# Patient Record
Sex: Female | Born: 1985 | Race: White | Hispanic: No | Marital: Single | Smoking: Current every day smoker
Health system: Southern US, Community
[De-identification: ages and names within clinical notes are randomized; demographics above are authoritative.]

## PROBLEM LIST (undated history)

## (undated) DIAGNOSIS — Z789 Other specified health status: Secondary | ICD-10-CM

## (undated) HISTORY — PX: NO PAST SURGERIES: SHX2092

## (undated) HISTORY — DX: Other specified health status: Z78.9

---
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
## 2006-08-19 ENCOUNTER — Encounter: Admission: RE | Admit: 2006-08-19 | Discharge: 2006-08-19 | Payer: Self-pay | Admitting: Family Medicine

## 2006-08-28 ENCOUNTER — Encounter: Admission: RE | Admit: 2006-08-28 | Discharge: 2006-08-28 | Payer: Self-pay | Admitting: Family Medicine

## 2007-05-24 ENCOUNTER — Other Ambulatory Visit: Admission: RE | Admit: 2007-05-24 | Discharge: 2007-05-24 | Payer: Self-pay | Admitting: Obstetrics and Gynecology

## 2007-12-02 ENCOUNTER — Ambulatory Visit: Payer: Self-pay | Admitting: Family Medicine

## 2007-12-02 ENCOUNTER — Inpatient Hospital Stay (HOSPITAL_COMMUNITY): Admission: AD | Admit: 2007-12-02 | Discharge: 2007-12-04 | Payer: Self-pay | Admitting: Family Medicine

## 2008-07-24 ENCOUNTER — Other Ambulatory Visit: Admission: RE | Admit: 2008-07-24 | Discharge: 2008-07-24 | Payer: Self-pay | Admitting: Obstetrics and Gynecology

## 2009-11-01 ENCOUNTER — Encounter: Admission: RE | Admit: 2009-11-01 | Discharge: 2009-11-01 | Payer: Self-pay | Admitting: Family Medicine

## 2010-02-19 ENCOUNTER — Other Ambulatory Visit
Admission: RE | Admit: 2010-02-19 | Discharge: 2010-02-19 | Payer: Self-pay | Source: Home / Self Care | Admitting: Obstetrics and Gynecology

## 2010-09-07 ENCOUNTER — Inpatient Hospital Stay (HOSPITAL_COMMUNITY)
Admission: AD | Admit: 2010-09-07 | Discharge: 2010-09-07 | Disposition: A | Discharge status: Home / Self Care | Payer: Managed Care, Other (non HMO) | Source: Ambulatory Visit | Attending: Obstetrics and Gynecology | Admitting: Obstetrics and Gynecology

## 2010-09-07 DIAGNOSIS — O479 False labor, unspecified: Secondary | ICD-10-CM | POA: Insufficient documentation

## 2010-09-09 ENCOUNTER — Inpatient Hospital Stay (HOSPITAL_COMMUNITY)
Admission: AD | Admit: 2010-09-09 | Discharge: 2010-09-11 | DRG: 775 | Disposition: A | Discharge status: Home / Self Care | Payer: Managed Care, Other (non HMO) | Source: Ambulatory Visit | Attending: Obstetrics & Gynecology | Admitting: Obstetrics & Gynecology

## 2010-09-09 LAB — DIFFERENTIAL
Basophils Relative: 0 % (ref 0–1)
Eosinophils Absolute: 0 10*3/uL (ref 0.0–0.7)
Lymphocytes Relative: 9 % — ABNORMAL LOW (ref 12–46)
Lymphs Abs: 1.9 10*3/uL (ref 0.7–4.0)
Monocytes Absolute: 1.2 10*3/uL — ABNORMAL HIGH (ref 0.1–1.0)
Monocytes Relative: 6 % (ref 3–12)
Neutro Abs: 17.5 10*3/uL — ABNORMAL HIGH (ref 1.7–7.7)

## 2010-09-09 LAB — CBC
HCT: 31 % — ABNORMAL LOW (ref 36.0–46.0)
Hemoglobin: 10.4 g/dL — ABNORMAL LOW (ref 12.0–15.0)
MCH: 28.8 pg (ref 26.0–34.0)
MCHC: 33.5 g/dL (ref 30.0–36.0)
Platelets: 120 10*3/uL — ABNORMAL LOW (ref 150–400)
RDW: 13.4 % (ref 11.5–15.5)

## 2010-09-10 ENCOUNTER — Other Ambulatory Visit (HOSPITAL_COMMUNITY): Payer: Self-pay | Admitting: Obstetrics and Gynecology

## 2010-09-11 LAB — CBC
HCT: 30.8 % — ABNORMAL LOW (ref 36.0–46.0)
MCHC: 32.5 g/dL (ref 30.0–36.0)
Platelets: 137 10*3/uL — ABNORMAL LOW (ref 150–400)
WBC: 16.3 10*3/uL — ABNORMAL HIGH (ref 4.0–10.5)

## 2010-09-12 LAB — RH IMMUNE GLOB WKUP(>/=20WKS)(NOT WOMEN'S HOSP): Unit division: 0

## 2010-12-09 LAB — RH IMMUNE GLOB WKUP(>/=20WKS)(NOT WOMEN'S HOSP)

## 2010-12-09 LAB — CBC
HCT: 38.5
MCHC: 34.1
MCV: 92

## 2010-12-09 LAB — RPR: RPR Ser Ql: NONREACTIVE

## 2012-07-24 ENCOUNTER — Other Ambulatory Visit: Payer: Self-pay | Admitting: Nurse Practitioner

## 2012-07-26 ENCOUNTER — Telehealth: Payer: Self-pay | Admitting: Nurse Practitioner

## 2012-07-26 NOTE — Telephone Encounter (Signed)
No pap on file since 2006

## 2012-07-26 NOTE — Telephone Encounter (Signed)
ntbs FOR pap

## 2012-07-27 NOTE — Telephone Encounter (Signed)
Done by MMM on 07/26/12

## 2012-08-16 ENCOUNTER — Encounter: Payer: Self-pay | Admitting: Family Medicine

## 2012-08-16 ENCOUNTER — Ambulatory Visit (INDEPENDENT_AMBULATORY_CARE_PROVIDER_SITE_OTHER): Payer: Managed Care, Other (non HMO) | Admitting: Family Medicine

## 2012-08-16 ENCOUNTER — Telehealth: Payer: Self-pay | Admitting: Nurse Practitioner

## 2012-08-16 VITALS — BP 131/85 | HR 80 | Temp 98.2°F | Wt 182.4 lb

## 2012-08-16 DIAGNOSIS — H8110 Benign paroxysmal vertigo, unspecified ear: Secondary | ICD-10-CM

## 2012-08-16 MED ORDER — MECLIZINE HCL 25 MG PO TABS: 25.0000 mg | ORAL_TABLET | Freq: Three times a day (TID) | ORAL | Status: DC | PRN

## 2012-08-16 NOTE — Telephone Encounter (Signed)
appt made

## 2012-08-16 NOTE — Progress Notes (Signed)
Patient ID: Alyssa Massey, female   DOB: [birth year], 27 y.o.   MRN: [id number] SUBJECTIVE:  Chief Complaint  Patient presents with  . Acute Visit    DIZZY FEELS LIKE ROOM SPINNING    HPI: Woke up and got up to turn the alarm off she found herself dizzy. Room spinning. No alcohol. Works at [employer]. Customer Network engineer. Denies pregnancy.menses due tomorrow.  PMH/PSH: reviewed/updated in Epic  SH/FH: reviewed/updated in Epic  Allergies: reviewed/updated in Epic  Medications: reviewed/updated in Epic  Immunizations: reviewed/updated in Epic  ROS: As above in the HPI. All other systems are stable or negative.  OBJECTIVE: APPEARANCE:  Patient in no acute distress.The patient appeared well nourished and normally developed. Acyanotic. Waist: VITAL SIGNS:BP 131/85  Pulse 80  Temp(Src) 98.2 F (36.8 C) (Oral)  Wt 182 lb 6.4 oz (82.736 kg)  LMP 07/16/2012 WF  SKIN: warm and  Dry without overt rashes, tattoos and scars  HEAD and Neck: without JVD, Head and scalp: normal Eyes:No scleral icterus. Fundi normal, eye movements normal. Ears: Auricle normal, canal normal, Tympanic membranes normal, insufflation normal. Nose: normal Throat: normal Neck & thyroid: normal  CHEST & LUNGS: Chest wall: normal Lungs: Clear  CVS: Reveals the PMI to be normally located. Regular rhythm, First and Second Heart sounds are normal,  absence of murmurs, rubs or gallops. Peripheral vasculature: Radial pulses: normal Dorsal pedis pulses: normal Posterior pulses: normal  ABDOMEN:  Appearance: normal Benign, no organomegaly, no masses, no Abdominal Aortic enlargement. No Guarding , no rebound. No Bruits. Bowel sounds: normal  RECTAL: N/A GU: N/A  EXTREMETIES: nonedematous. Both Femoral and Pedal pulses are normal.  MUSCULOSKELETAL:  Spine: normal Joints: intact  NEUROLOGIC: oriented to time,place and person; nonfocal. Strength is normal Sensory is  normal Reflexes are normal Cranial Nerves are normal. Dix-Halpike is positive mildly both right and left but more significant with rotation to the left and right ward gaze.  ASSESSMENT: Benign paroxysmal positional vertigo - Plan: meclizine (ANTIVERT) 25 MG tablet  PLAN: No orders of the defined types were placed in this encounter.   Handout from [facility] date on Modified epley's exercises given to patient. Meds ordered this encounter  Medications  . meclizine (ANTIVERT) 25 MG tablet    Sig: Take 1 tablet (25 mg total) by mouth 3 (three) times daily as needed.    Dispense:  30 tablet    Refill:  0  discussed treatment of vertigo.  Return if symptoms worsen or fail to improve.  [place] P. Modesto Charon, M.D.

## 2012-08-17 ENCOUNTER — Ambulatory Visit: Payer: Self-pay | Admitting: Family Medicine

## 2013-01-10 ENCOUNTER — Other Ambulatory Visit: Payer: Self-pay | Admitting: Nurse Practitioner

## 2013-01-12 NOTE — Telephone Encounter (Signed)
Last seen 08/16/12  [facility]  Do not see PAP in EPIC

## 2013-01-13 NOTE — Telephone Encounter (Signed)
Patient needs to be seen. Has exceeded time since last visit. Limited quantity refilled. Needs to bring all medications to next appointment.   

## 2013-03-15 ENCOUNTER — Telehealth: Payer: Self-pay | Admitting: Nurse Practitioner

## 2013-03-21 NOTE — Telephone Encounter (Signed)
Pt went to urgent care.

## 2013-05-16 ENCOUNTER — Ambulatory Visit (INDEPENDENT_AMBULATORY_CARE_PROVIDER_SITE_OTHER): Payer: Managed Care, Other (non HMO) | Admitting: Family Medicine

## 2013-05-16 ENCOUNTER — Encounter: Payer: Self-pay | Admitting: Family Medicine

## 2013-05-16 VITALS — BP 124/77 | HR 92 | Temp 98.2°F | Ht 65.5 in | Wt 192.8 lb

## 2013-05-16 DIAGNOSIS — G43909 Migraine, unspecified, not intractable, without status migrainosus: Secondary | ICD-10-CM

## 2013-05-16 DIAGNOSIS — F329 Major depressive disorder, single episode, unspecified: Secondary | ICD-10-CM

## 2013-05-16 DIAGNOSIS — F3289 Other specified depressive episodes: Secondary | ICD-10-CM

## 2013-05-16 DIAGNOSIS — R5381 Other malaise: Secondary | ICD-10-CM

## 2013-05-16 DIAGNOSIS — F32A Depression, unspecified: Secondary | ICD-10-CM

## 2013-05-16 DIAGNOSIS — R5383 Other fatigue: Secondary | ICD-10-CM

## 2013-05-16 LAB — POCT CBC
Granulocyte percent: 76.4 %G (ref 37–80)
HCT, POC: 42 % (ref 37.7–47.9)
Hemoglobin: 13.9 g/dL (ref 12.2–16.2)
Lymph, poc: 2.2 (ref 0.6–3.4)
MCH, POC: 29.1 pg (ref 27–31.2)
MCHC: 33.2 g/dL (ref 31.8–35.4)
MCV: 87.6 fL (ref 80–97)
MPV: 9 fL (ref 0–99.8)
POC Granulocyte: 7.5 — AB (ref 2–6.9)
POC LYMPH PERCENT: 22.5 %L (ref 10–50)
Platelet Count, POC: 166 10*3/uL (ref 142–424)
RBC: 4.8 M/uL (ref 4.04–5.48)
RDW, POC: 12.8 %
WBC: 9.8 10*3/uL (ref 4.6–10.2)

## 2013-05-16 MED ORDER — SERTRALINE HCL 50 MG PO TABS: 50.0000 mg | ORAL_TABLET | Freq: Every day | ORAL | Status: DC

## 2013-05-16 MED ORDER — ALPRAZOLAM 0.5 MG PO TABS: 0.5000 mg | ORAL_TABLET | Freq: Two times a day (BID) | ORAL | Status: DC | PRN

## 2013-05-16 MED ORDER — TOPIRAMATE 50 MG PO TABS
50.0000 mg | ORAL_TABLET | Freq: Two times a day (BID) | ORAL | Status: DC
Start: 2013-05-16 — End: 2015-07-31

## 2013-05-16 MED ORDER — SUMATRIPTAN SUCCINATE 100 MG PO TABS: ORAL_TABLET | ORAL | Status: DC

## 2013-05-16 NOTE — Progress Notes (Signed)
   Subjective:    Patient ID: Alyssa Massey, female    DOB: 03-20-1985, 28 y.o.   MRN: [id number]  HPI This 28 y.o. female presents for evaluation of headaches every day.  She has taken tylenol and it helps sometimes.  She has migraine features.  She has phonophotophobia, aura, nausea, and pulsatile Headache.  She is having mood problems.  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  No suicidal or homicidal ideation.  Work is her stressor.    Review of Systems C/o headhaces and depression No chest pain, SOB, HA, dizziness, vision change, N/V, diarrhea, constipation, dysuria, urinary urgency or frequency, myalgias, arthralgias or rash.      Objective:   Physical Exam  Vital signs noted  Well developed well nourished female.  HEENT - Head atraumatic Normocephalic                Eyes - PERRLA, Conjuctiva - clear Sclera- Clear EOMI                Ears - EAC's Wnl TM's Wnl Gross Hearing WNL                Nose - Nares patent                 Throat - oropharanx wnl Respiratory - Lungs CTA bilateral Cardiac - RRR S1 and S2 without murmur GI - Abdomen soft Nontender and bowel sounds active x 4 Extremities - No edema. Neuro - Grossly intact.      Assessment & Plan:  Migraine - Plan: SUMAtriptan (IMITREX) 100 MG tablet, topiramate (TOPAMAX) 50 MG tablet, CT Head W Contrast  Depression - Plan: sertraline (ZOLOFT) 50 MG tablet, ALPRAZolam (XANAX) 0.5 MG tablet, POCT CBC, CMP14+EGFR, Thyroid Panel With TSH  Other malaise and fatigue - Plan: POCT CBC, CMP14+EGFR, Thyroid Panel With TSH  Lysbeth Penner FNP

## 2013-05-17 LAB — THYROID PANEL WITH TSH
Free Thyroxine Index: 2.3 (ref 1.2–4.9)
T3 Uptake Ratio: 19 % — ABNORMAL LOW (ref 24–39)
T4, Total: 12.1 ug/dL — ABNORMAL HIGH (ref 4.5–12.0)
TSH: 0.521 u[IU]/mL (ref 0.450–4.500)

## 2013-05-17 LAB — CMP14+EGFR
ALT: 10 IU/L (ref 0–32)
AST: 12 IU/L (ref 0–40)
Albumin/Globulin Ratio: 1.9 (ref 1.1–2.5)
Albumin: 3.8 g/dL (ref 3.5–5.5)
Alkaline Phosphatase: 82 IU/L (ref 39–117)
BUN/Creatinine Ratio: 6 — ABNORMAL LOW (ref 8–20)
BUN: 4 mg/dL — ABNORMAL LOW (ref 6–20)
CO2: 23 mmol/L (ref 18–29)
Calcium: 8.9 mg/dL (ref 8.7–10.2)
Chloride: 103 mmol/L (ref 97–108)
Creatinine, Ser: 0.64 mg/dL (ref 0.57–1.00)
GFR calc Af Amer: 141 mL/min/{1.73_m2} (ref >59)
GFR calc non Af Amer: 123 mL/min/{1.73_m2} (ref >59)
Globulin, Total: 2 g/dL (ref 1.5–4.5)
Glucose: 90 mg/dL (ref 65–99)
Potassium: 4.4 mmol/L (ref 3.5–5.2)
Sodium: 140 mmol/L (ref 134–144)
Total Bilirubin: 0.2 mg/dL (ref 0.0–1.2)
Total Protein: 5.8 g/dL — ABNORMAL LOW (ref 6.0–8.5)

## 2013-05-18 ENCOUNTER — Telehealth: Payer: Self-pay

## 2013-05-18 NOTE — Telephone Encounter (Signed)
Please call patient   She had a panic attack today and couldn't go to work  And she wants her lab results

## 2013-05-19 ENCOUNTER — Ambulatory Visit (HOSPITAL_COMMUNITY)
Admission: RE | Admit: 2013-05-19 | Discharge: 2013-05-19 | Disposition: A | Discharge status: Home / Self Care | Payer: Managed Care, Other (non HMO) | Source: Ambulatory Visit | Attending: Family Medicine | Admitting: Family Medicine

## 2013-05-19 ENCOUNTER — Ambulatory Visit (HOSPITAL_COMMUNITY): Admission: RE | Admit: 2013-05-19 | Payer: Managed Care, Other (non HMO) | Source: Ambulatory Visit

## 2013-05-19 ENCOUNTER — Telehealth: Payer: Self-pay | Admitting: Family Medicine

## 2013-05-19 ENCOUNTER — Telehealth: Payer: Self-pay | Admitting: *Deleted

## 2013-05-19 DIAGNOSIS — R51 Headache: Secondary | ICD-10-CM

## 2013-05-19 NOTE — Telephone Encounter (Signed)
Pt [facility] Correct order entered in Epic

## 2013-05-20 NOTE — Telephone Encounter (Signed)
Took Xanax and Topamax while at work and she developed a rapid pounding heart rate about 45 minutes after taking it. EMS called. Pulse was 88. BP was 155/97. They felt that she was having a panic attack.  She cut back to taking Topamax qhs instead of BID and hasn't had the same problem. She hasn't needed Imitrex but has taken Tylenol for headache.  [facility] Patient aware to seek care sooner if necessary.

## 2013-05-23 ENCOUNTER — Ambulatory Visit (INDEPENDENT_AMBULATORY_CARE_PROVIDER_SITE_OTHER): Payer: Managed Care, Other (non HMO) | Admitting: Family Medicine

## 2013-05-23 ENCOUNTER — Encounter: Payer: Self-pay | Admitting: Family Medicine

## 2013-05-23 VITALS — BP 116/73 | HR 89 | Temp 97.7°F | Ht 65.5 in | Wt 191.0 lb

## 2013-05-23 DIAGNOSIS — R259 Unspecified abnormal involuntary movements: Secondary | ICD-10-CM

## 2013-05-23 DIAGNOSIS — R251 Tremor, unspecified: Secondary | ICD-10-CM

## 2013-05-23 DIAGNOSIS — F410 Panic disorder [episodic paroxysmal anxiety] without agoraphobia: Secondary | ICD-10-CM

## 2013-05-23 DIAGNOSIS — E0590 Thyrotoxicosis, unspecified without thyrotoxic crisis or storm: Secondary | ICD-10-CM

## 2013-05-23 MED ORDER — PROPYLTHIOURACIL 50 MG PO TABS: 100.0000 mg | ORAL_TABLET | Freq: Three times a day (TID) | ORAL | Status: DC

## 2013-05-23 MED ORDER — PROPRANOLOL HCL 10 MG PO TABS: 10.0000 mg | ORAL_TABLET | Freq: Three times a day (TID) | ORAL | Status: DC

## 2013-05-23 NOTE — Progress Notes (Signed)
   Subjective:    Patient ID: Alyssa Massey, female    DOB: 1985/03/16, 28 y.o.   MRN: [id number]  HPI This 28 y.o. female presents for evaluation of headaches and depression.  She has been having Episodes of chest pain and anxiety.  She had EMS come to the work place and she was told She had panic attack.  She was sent home from work.  She has been recommended by her employer To take leave of absence.  She has been working with the public.  She notices her anxiety level Increasing.  She has elevated T4 and low normal TSH. She is having tachycardia, tremors.   Review of Systems C/o panic, tremors, and tachycardia No chest pain, SOB, HA, dizziness, vision change, N/V, diarrhea, constipation, dysuria, urinary urgency or frequency, myalgias, arthralgias or rash.     Objective:   Physical Exam Vital signs noted  Well developed well nourished female.  HEENT - Head atraumatic Normocephalic                Eyes - PERRLA, Conjuctiva - clear Sclera- Clear EOMI                Ears - EAC's Wnl TM's Wnl Gross Hearing WNL                Nose - Nares patent                 Throat - oropharanx wnl Respiratory - Lungs CTA bilateral Cardiac - RRR S1 and S2 without murmur GI - Abdomen soft Nontender and bowel sounds active x 4 Extremities - No edema. Neuro - Fine tremors in hands bilateral      Assessment & Plan:  Hyperthyroidism - Plan: propranolol (INDERAL) 10 MG tablet, propylthiouracil (PTU) 50 MG tablet, Ambulatory referral to Endocrinology, Thyroid Panel With TSH.  Note to be out of work for 2 weeks.  Panic - Plan: Sertraline and xanax  Tremors - INderal 10mg  po tid  Follow up in 4 days  Deatra CanterWilliam J Aariel Ems FNP

## 2013-05-24 LAB — THYROID PANEL WITH TSH
Free Thyroxine Index: 2.3 (ref 1.2–4.9)
T3 Uptake Ratio: 18 % — ABNORMAL LOW (ref 24–39)
T4, Total: 12.9 ug/dL — ABNORMAL HIGH (ref 4.5–12.0)
TSH: 0.472 u[IU]/mL (ref 0.450–4.500)

## 2013-05-27 ENCOUNTER — Ambulatory Visit (INDEPENDENT_AMBULATORY_CARE_PROVIDER_SITE_OTHER): Payer: Managed Care, Other (non HMO) | Admitting: Family Medicine

## 2013-05-27 VITALS — BP 116/65 | HR 94 | Temp 97.8°F | Ht 65.5 in | Wt 191.2 lb

## 2013-05-27 DIAGNOSIS — E0590 Thyrotoxicosis, unspecified without thyrotoxic crisis or storm: Secondary | ICD-10-CM

## 2013-05-27 DIAGNOSIS — F410 Panic disorder [episodic paroxysmal anxiety] without agoraphobia: Secondary | ICD-10-CM

## 2013-05-27 NOTE — Progress Notes (Signed)
   Subjective:    Patient ID: Marja KaysAmber N Mandley, female    DOB: Sep 09, 1985, 28 y.o.   MRN: [id number]  HPI This 28 y.o. female presents for evaluation of follow up on hyperthyroidism.  She has not had  opthamology appointment.  She has been having problems with panic on the job and she was seen Initially and started on xanax and sertraline which help but she is still getting anxious and having Panic when she is at work.  She has also been having tremors, tachycardia, insomnia, and Racing thoughts.  She had some recent labs which show hyperthyroid state. She was put on PTU And inderal and feels better but is still having insomnia and anxiety.  She is awaiting an  Appointment with endocrinology.   Review of Systems No chest pain, SOB, HA, dizziness, vision change, N/V, diarrhea, constipation, dysuria, urinary urgency or frequency, myalgias, arthralgias or rash.     Objective:   Physical Exam  Vital signs noted  Well developed well nourished anxious female.  HEENT - Head atraumatic Normocephalic                Eyes - PERRLA, Conjuctiva - clear Sclera- Clear EOMI                Ears - EAC's Wnl TM's Wnl Gross Hearing WNL                Throat - oropharanx wnl Respiratory - Lungs CTA bilateral Cardiac - RRR S1 and S2 without murmur GI - Abdomen soft Nontender and bowel sounds active x 4 Extremities - No edema. Neuro - Grossly intact.      Assessment & Plan:  Hyperthyroidism - Continue PTU and inderal and get eye appointment.  Await pending endocrinology appointment.  She is out of work until 06/06/13 and if not able to go to  Work then follow up here 06/06/13.  Panic - Continue xanax and sertraline.  Consider referral to psychiatry if not better. Discussed with patient that her symptoms are due to hyperthyroid state and panic should diminish With tx of hyperthyroidism.  Follow up 06/06/13 if not better.  Paper work for disability filled out to be Out of work. Over 30 minutes spent  with patient in visit.  Anselm PancoastWilliam J Himani Corona FNP  Deatra CanterWilliam J Kevante Lunt FNP

## 2013-05-30 ENCOUNTER — Other Ambulatory Visit: Payer: Self-pay | Admitting: Family Medicine

## 2013-05-30 DIAGNOSIS — E0590 Thyrotoxicosis, unspecified without thyrotoxic crisis or storm: Secondary | ICD-10-CM

## 2013-05-30 MED ORDER — PROPYLTHIOURACIL 50 MG PO TABS
100.0000 mg | ORAL_TABLET | Freq: Three times a day (TID) | ORAL | Status: DC
Start: 2013-05-30 — End: 2015-07-31

## 2013-06-02 ENCOUNTER — Ambulatory Visit (INDEPENDENT_AMBULATORY_CARE_PROVIDER_SITE_OTHER): Payer: Managed Care, Other (non HMO) | Admitting: Family Medicine

## 2013-06-02 ENCOUNTER — Other Ambulatory Visit: Payer: Managed Care, Other (non HMO)

## 2013-06-02 ENCOUNTER — Encounter: Payer: Self-pay | Admitting: Family Medicine

## 2013-06-02 VITALS — BP 117/80 | HR 74 | Temp 97.9°F | Ht 65.5 in | Wt 190.0 lb

## 2013-06-02 DIAGNOSIS — E0590 Thyrotoxicosis, unspecified without thyrotoxic crisis or storm: Secondary | ICD-10-CM

## 2013-06-02 NOTE — Progress Notes (Signed)
Patient came in for labs only.

## 2013-06-02 NOTE — Progress Notes (Signed)
   Subjective:    Patient ID: Alyssa Massey, female    DOB: [birth year], 28 y.o.   MRN: [id number]  HPI  This 28 y.o. female presents for evaluation of follow up on her hyperthyroidism.  She has been feeling weak, tired, left neck discomfort, and nausea, and headache.  She has taken imitrex this am.  Her recent labs showed she was having elevated T4 and this was despite being on PTU 50mg  po tid. She was called a few days ago and told to increase to 2 50mg  tablets or PTU tid. She states she  Has been taking 4 pills qid.  She feels tired. She did get labs today.  She is waiting for endocrinology appointment in a month. She has an eye appointment next week..  Review of Systems No chest pain, SOB, HA, dizziness, vision change, N/V, diarrhea, constipation, dysuria, urinary urgency or frequency, myalgias, arthralgias or rash.     Objective:   Physical Exam Vital signs noted  Well developed well nourished female.  HEENT - Head atraumatic Normocephalic                Eyes - PERRLA, Conjuctiva - clear Sclera- Clear EOMI                Ears - EAC's Wnl TM's Wnl Gross Hearing WNL                Nose - Nares patent                 Throat - oropharanx wnl Respiratory - Lungs CTA bilateral Cardiac - RRR S1 and S2 without murmur GI - Abdomen soft Nontender and bowel sounds active x 4 Extremities - No edema. Neuro - Grossly intact.       Assessment & Plan:  Hyperthyroidism Cut back on PTU to 50mg  tablet 2 po tid and recheck labs in a week. Follow up in one week.  Deatra CanterWilliam J [place] FNP

## 2013-06-03 LAB — THYROID PANEL WITH TSH
Free Thyroxine Index: 2.6 (ref 1.2–4.9)
T3 Uptake Ratio: 22 % — ABNORMAL LOW (ref 24–39)
T4, Total: 11.8 ug/dL (ref 4.5–12.0)
TSH: 0.864 u[IU]/mL (ref 0.450–4.500)

## 2013-06-06 ENCOUNTER — Telehealth: Payer: Self-pay | Admitting: Family Medicine

## 2013-06-08 ENCOUNTER — Encounter: Payer: Self-pay | Admitting: Family Medicine

## 2013-06-08 ENCOUNTER — Ambulatory Visit (INDEPENDENT_AMBULATORY_CARE_PROVIDER_SITE_OTHER): Payer: Managed Care, Other (non HMO) | Admitting: Family Medicine

## 2013-06-08 VITALS — BP 105/72 | HR 87 | Temp 98.6°F | Ht 65.5 in | Wt 191.0 lb

## 2013-06-08 DIAGNOSIS — E0590 Thyrotoxicosis, unspecified without thyrotoxic crisis or storm: Secondary | ICD-10-CM

## 2013-06-08 NOTE — Progress Notes (Signed)
   Subjective:    Patient ID: Alyssa Massey, female    DOB: 1985-10-31, 28 y.o.   MRN: [id number]  HPI  C/o anxiety and panic and has been recently dx'd with hyperthyroidism.  She has been still been anxious and doesn't think she can do her job because of the stress and she is still having panic Despite taking xanax and sertraline. She is taking PTU 50mg  2 po tid and inderal 10mg  po tid.  Review of Systems    No chest pain, SOB, HA, dizziness, vision change, N/V, diarrhea, constipation, dysuria, urinary urgency or frequency, myalgias, arthralgias or rash.  Objective:   Physical Exam  Vital signs noted  Well developed well nourished female.  HEENT - Head atraumatic Normocephalic                Eyes - PERRLA, Conjuctiva - clear Sclera- Clear EOMI                Ears - EAC's Wnl TM's Wnl Gross Hearing WNL                Nose - Nares patent                 Throat - oropharanx wnl Respiratory - Lungs CTA bilateral Cardiac - RRR S1 and S2 without murmur GI - Abdomen soft Nontender and bowel sounds active x 4 Extremities - No edema. Neuro - Grossly intact.      Assessment & Plan:  Hyperthyroidism Follow up with Endocrinology  Deatra CanterWilliam J [place] FNP

## 2013-06-08 NOTE — Telephone Encounter (Signed)
appt scheduled Pt notified 

## 2013-06-08 NOTE — Telephone Encounter (Signed)
Please help.

## 2013-06-16 ENCOUNTER — Other Ambulatory Visit: Payer: Managed Care, Other (non HMO)

## 2013-06-17 ENCOUNTER — Ambulatory Visit: Payer: Managed Care, Other (non HMO) | Admitting: Family Medicine

## 2013-06-28 ENCOUNTER — Other Ambulatory Visit: Payer: Self-pay | Admitting: Nurse Practitioner

## 2013-06-30 NOTE — Telephone Encounter (Signed)
Last seen 06/17/13 B [place]  Do not see a PAP in EPIC

## 2014-04-15 IMAGING — CT CT HEAD W/O CM
1 series · 16 of 30 positions shown, 20 images · non-contrast
Comparison: None.

CLINICAL DATA: Headache on and off since 4 days ago.

EXAM:
CT HEAD WITHOUT CONTRAST
TECHNIQUE: Contiguous axial images were obtained from the base of the skull
through the vertex without intravenous contrast.

[Series 2: headseq 4.8 h37s · axial · 0.43mm/px · z∈[+122,+276]mm · 16 of 36 slices shown, 20 images]
[im 2/36  brain]
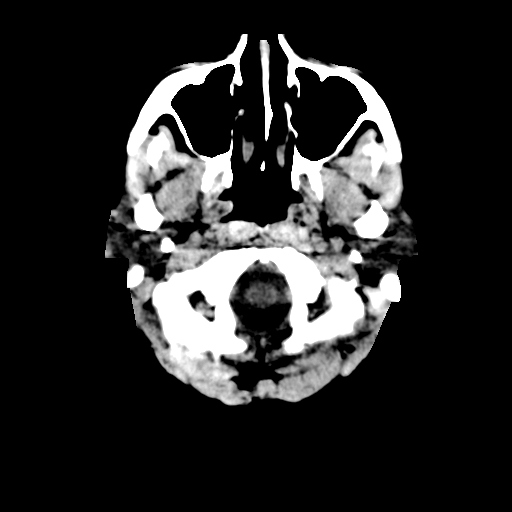
[im 2/36  bone]
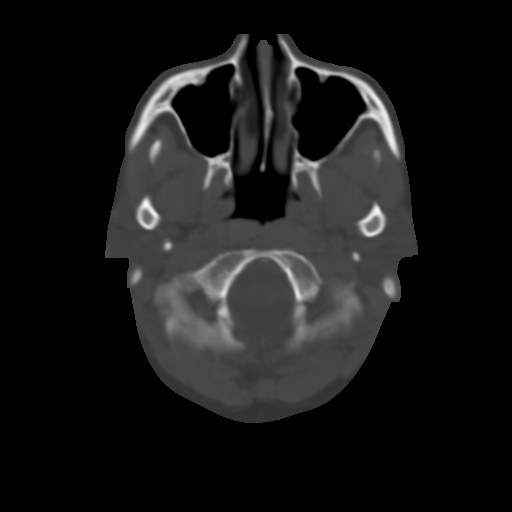
[im 4/36  brain]
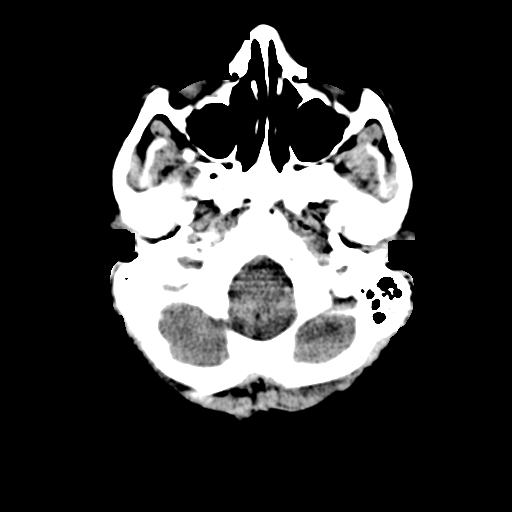
[im 7/36  brain]
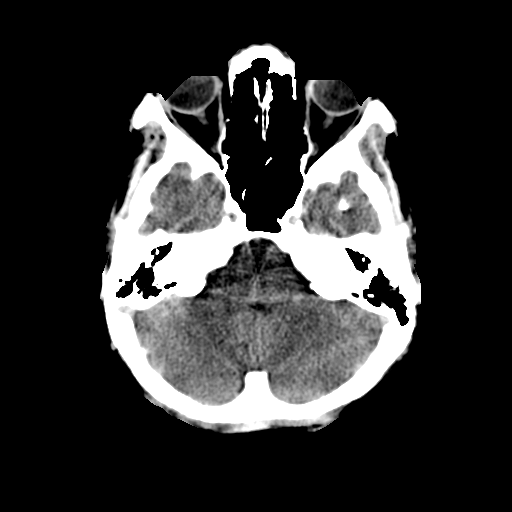
[im 9/36  brain]
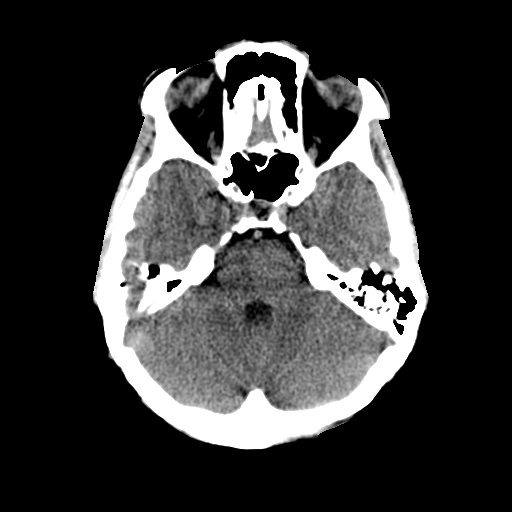
[im 10/36  brain]
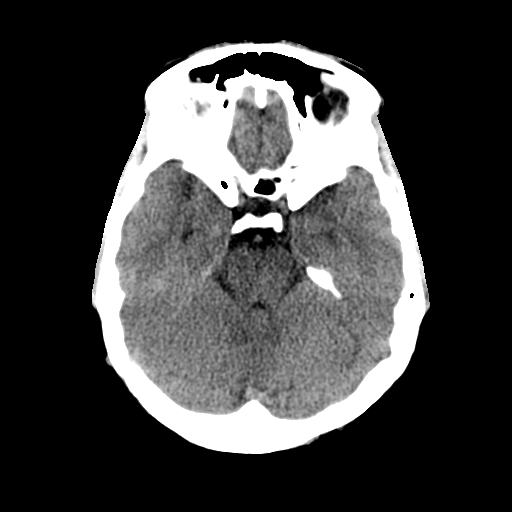
[im 10/36  bone]
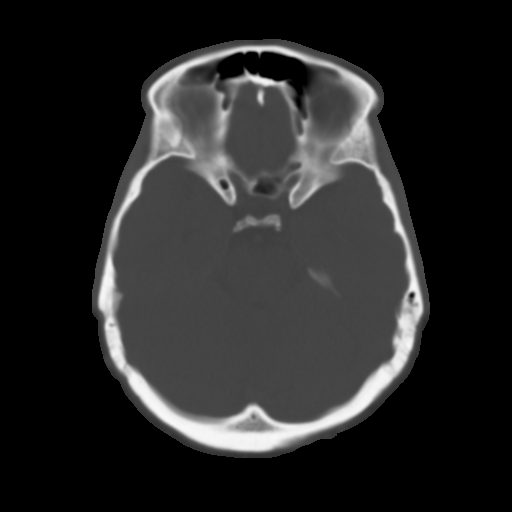
[im 13/36  brain]
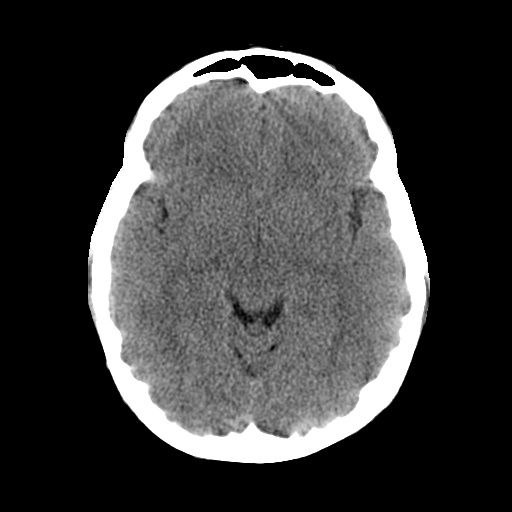
[im 15/36  brain]
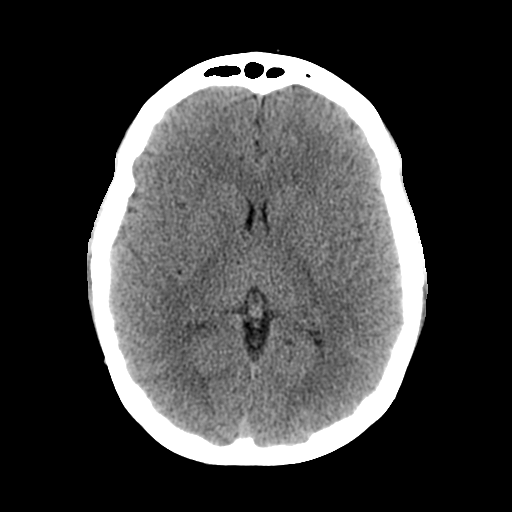
[im 17/36  brain]
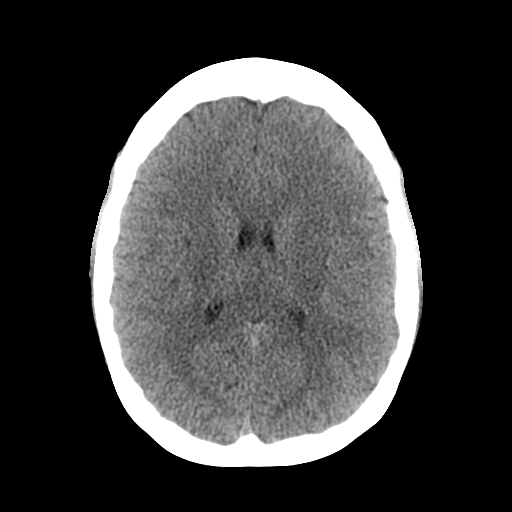
[im 19/36  brain]
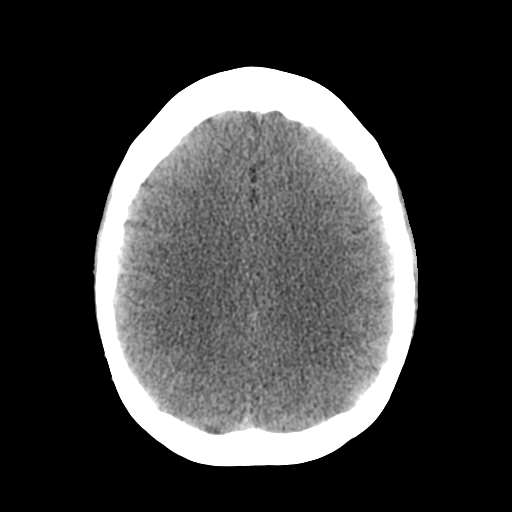
[im 19/36  bone]
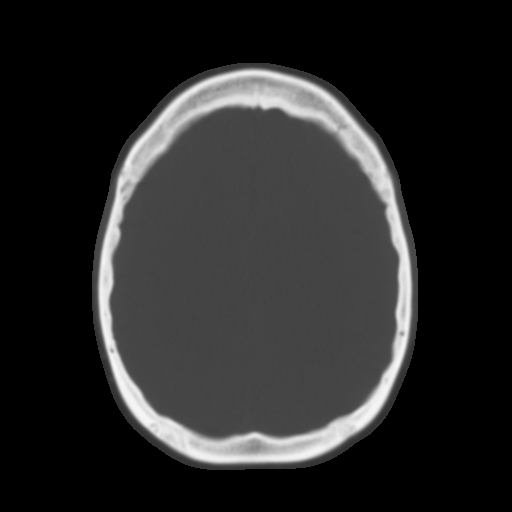
[im 21/36  brain]
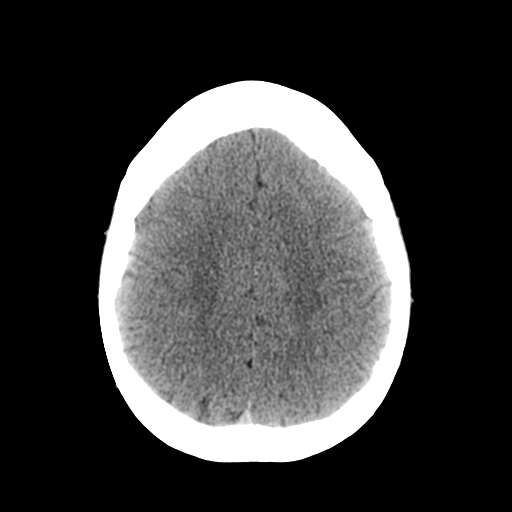
[im 23/36  brain]
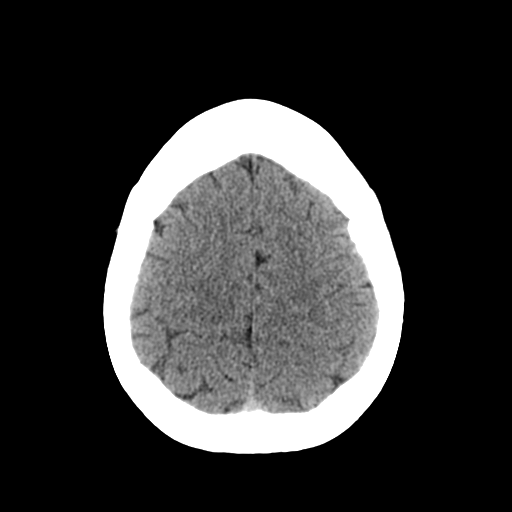
[im 26/36  brain]
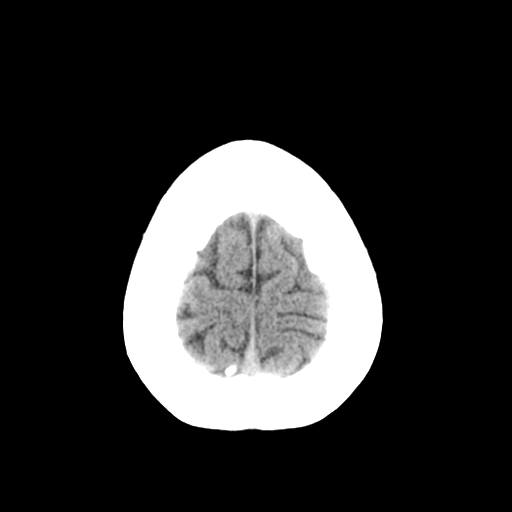
[im 27/36  brain]
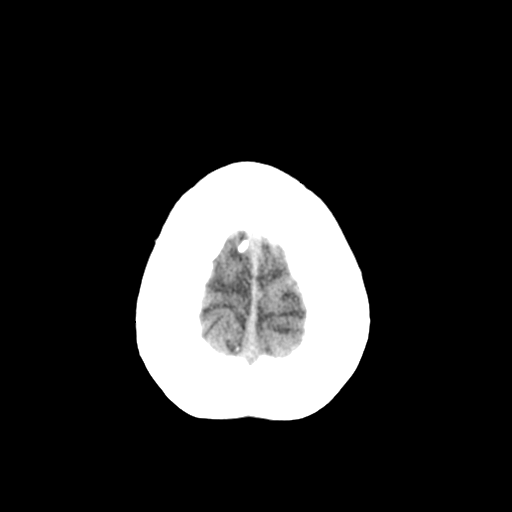
[im 27/36  bone]
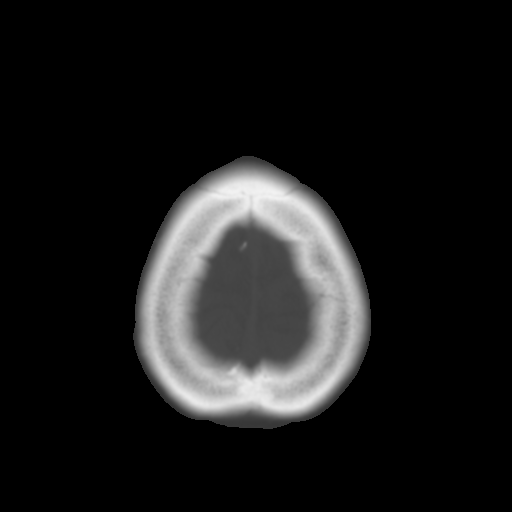
[im 29/36  brain]
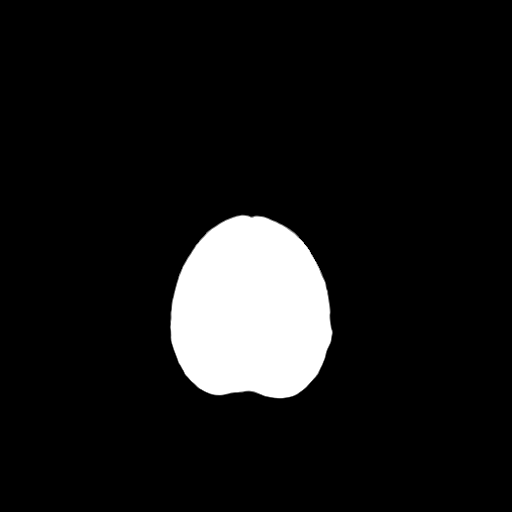
[im 32/36  brain]
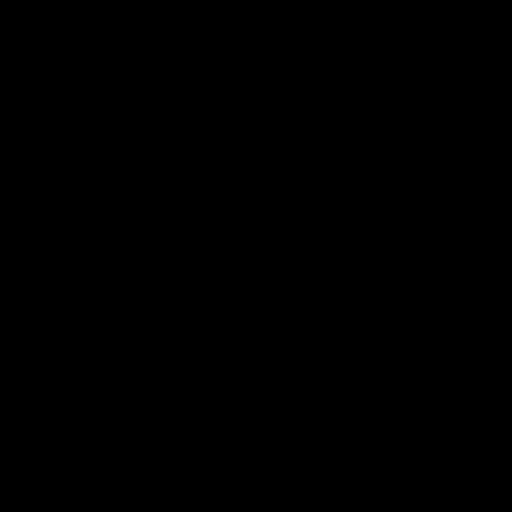
[im 34/36  brain]
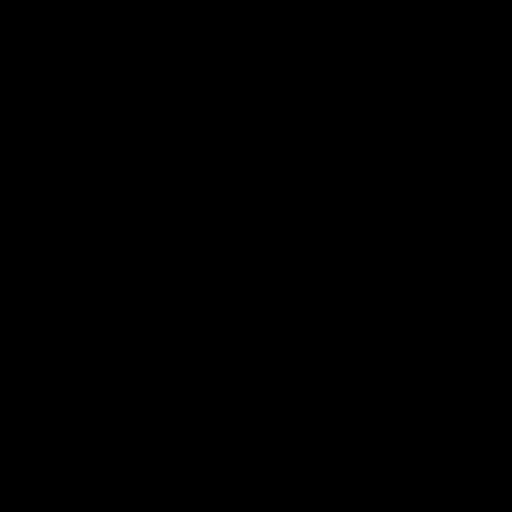

[16 of 30 positions shown; findings below may reference images not displayed]

FINDINGS: Ventricles are normal in size and configuration.

No parenchymal masses or mass effect.  No midline shift.

No areas of abnormal parenchymal attenuation. No evidence of an
infarct.

No extra-axial masses or abnormal fluid collections.

No intracranial hemorrhage.

Visualized sinuses and mastoid air cells are clear. No skull lesion.
IMPRESSION: Normal unenhanced CT scan of the brain.

## 2014-11-22 ENCOUNTER — Telehealth: Payer: Self-pay | Admitting: Family Medicine

## 2014-11-22 NOTE — Telephone Encounter (Signed)
Resending records again today

## 2015-07-31 ENCOUNTER — Encounter: Payer: Self-pay | Admitting: Obstetrics & Gynecology

## 2015-07-31 ENCOUNTER — Ambulatory Visit (INDEPENDENT_AMBULATORY_CARE_PROVIDER_SITE_OTHER): Payer: Medicaid Other | Admitting: Obstetrics & Gynecology

## 2015-07-31 ENCOUNTER — Other Ambulatory Visit (HOSPITAL_COMMUNITY)
Admission: RE | Admit: 2015-07-31 | Discharge: 2015-07-31 | Disposition: A | Discharge status: Home / Self Care | Payer: Medicaid Other | Source: Ambulatory Visit | Attending: Obstetrics & Gynecology | Admitting: Obstetrics & Gynecology

## 2015-07-31 VITALS — BP 120/70 | HR 80 | Ht 64.0 in | Wt 153.0 lb

## 2015-07-31 DIAGNOSIS — Z0000 Encounter for general adult medical examination without abnormal findings: Secondary | ICD-10-CM

## 2015-07-31 DIAGNOSIS — Z01419 Encounter for gynecological examination (general) (routine) without abnormal findings: Secondary | ICD-10-CM | POA: Insufficient documentation

## 2015-07-31 MED ORDER — DESOGESTREL-ETHINYL ESTRADIOL 0.15-30 MG-MCG PO TABS: 1.0000 | ORAL_TABLET | Freq: Every day | ORAL | Status: DC

## 2015-07-31 NOTE — Progress Notes (Signed)
Patient ID: Alyssa Massey, female   DOB: 1985/07/23, 30 y.o.   MRN: [id number] Subjective:     Alyssa Massey is a 30 y.o. female here for a routine exam.  Patient's last menstrual period was 07/13/2015. No obstetric history on file. Birth Control Method:  None currently Menstrual Calendar(currently): regular  Current complaints: none.   Current acute medical issues:  none   Recent Gynecologic History Patient's last menstrual period was 07/13/2015. Last Pap2012: ,  normal Last mammogram: ,    History reviewed. No pertinent past medical history.  History reviewed. No pertinent past surgical history.  OB History    Gravida Para Term Preterm AB TAB SAB Ectopic Multiple Living   2 2 2  0 0 0 0 0 0 2      Social History   Social History  . Marital Status: Single    Spouse Name: N/A  . Number of Children: N/A  . Years of Education: N/A   Social History Main Topics  . Smoking status: Current Every Day Smoker    Types: Cigarettes  . Smokeless tobacco: None  . Alcohol Use: None  . Drug Use: None  . Sexual Activity: Not Asked   Other Topics Concern  . None   Social History Narrative    Family History  Problem Relation Age of Onset  . Alcohol abuse Father      Current outpatient prescriptions:  .  cephALEXin (KEFLEX) 500 MG capsule, Take 500 mg by mouth 4 (four) times daily., Disp: , Rfl:  .  phenazopyridine (PYRIDIUM) 200 MG tablet, Take 200 mg by mouth 3 (three) times daily as needed for pain., Disp: , Rfl:  .  desogestrel-ethinyl estradiol (APRI,EMOQUETTE,SOLIA) 0.15-30 MG-MCG tablet, Take 1 tablet by mouth daily., Disp: 1 Package, Rfl: 11  Review of Systems  Review of Systems  Constitutional: Negative for fever, chills, weight loss, malaise/fatigue and diaphoresis.  HENT: Negative for hearing loss, ear pain, nosebleeds, congestion, sore throat, neck pain, tinnitus and ear discharge.   Eyes: Negative for blurred vision, double vision, photophobia, pain,  discharge and redness.  Respiratory: Negative for cough, hemoptysis, sputum production, shortness of breath, wheezing and stridor.   Cardiovascular: Negative for chest pain, palpitations, orthopnea, claudication, leg swelling and PND.  Gastrointestinal: negative for abdominal pain. Negative for heartburn, nausea, vomiting, diarrhea, constipation, blood in stool and melena.  Genitourinary: Negative for dysuria, urgency, frequency, hematuria and flank pain.  Musculoskeletal: Negative for myalgias, back pain, joint pain and falls.  Skin: Negative for itching and rash.  Neurological: Negative for dizziness, tingling, tremors, sensory change, speech change, focal weakness, seizures, loss of consciousness, weakness and headaches.  Endo/Heme/Allergies: Negative for environmental allergies and polydipsia. Does not bruise/bleed easily.  Psychiatric/Behavioral: Negative for depression, suicidal ideas, hallucinations, memory loss and substance abuse. The patient is not nervous/anxious and does not have insomnia.        Objective:  Blood pressure 120/70, pulse 80, height 5\' 4"  (1.626 m), weight 153 lb (69.4 kg), last menstrual period 07/13/2015.   Physical Exam  Vitals reviewed. Constitutional: She is oriented to person, place, and time. She appears well-developed and well-nourished.  HENT:  Head: Normocephalic and atraumatic.        Right Ear: External ear normal.  Left Ear: External ear normal.  Nose: Nose normal.  Mouth/Throat: Oropharynx is clear and moist.  Eyes: Conjunctivae and EOM are normal. Pupils are equal, round, and reactive to light. Right eye exhibits no discharge. Left eye exhibits no discharge. No  scleral icterus.  Neck: Normal range of motion. Neck supple. No tracheal deviation present. No thyromegaly present.  Cardiovascular: Normal rate, regular rhythm, normal heart sounds and intact distal pulses.  Exam reveals no gallop and no friction rub.   No murmur heard. Respiratory: Effort  normal and breath sounds normal. No respiratory distress. She has no wheezes. She has no rales. She exhibits no tenderness.  GI: Soft. Bowel sounds are normal. She exhibits no distension and no mass. There is no tenderness. There is no rebound and no guarding.  Genitourinary:  Breasts no masses skin changes or nipple changes bilaterally      Vulva is normal without lesions Vagina is pink moist without discharge Cervix normal in appearance and pap is done Uterus is normal size shape and contour Adnexa is negative with normal sized ovaries   Musculoskeletal: Normal range of motion. She exhibits no edema and no tenderness.  Neurological: She is alert and oriented to person, place, and time. She has normal reflexes. She displays normal reflexes. No cranial nerve deficit. She exhibits normal muscle tone. Coordination normal.  Skin: Skin is warm and dry. No rash noted. No erythema. No pallor.  Psychiatric: She has a normal mood and affect. Her behavior is normal. Judgment and thought content normal.       Medications Ordered at today's visit: Meds ordered this encounter  Medications  . cephALEXin (KEFLEX) 500 MG capsule    Sig: Take 500 mg by mouth 4 (four) times daily.  . phenazopyridine (PYRIDIUM) 200 MG tablet    Sig: Take 200 mg by mouth 3 (three) times daily as needed for pain.  Marland Kitchen desogestrel-ethinyl estradiol (APRI,EMOQUETTE,SOLIA) 0.15-30 MG-MCG tablet    Sig: Take 1 tablet by mouth daily.    Dispense:  1 Package    Refill:  11    Other orders placed at today's visit: No orders of the defined types were placed in this encounter.      Assessment:    Healthy female exam.   Well woman exam with routine gynecological exam - Plan: Cytology - PAP   Plan:    Contraception: OCP (estrogen/progesterone). Follow up in: 1 year.     Return in about 1 year (around 07/30/2016) for yearly, with Dr Despina Hidden.

## 2015-08-01 LAB — CYTOLOGY - PAP

## 2016-02-14 ENCOUNTER — Other Ambulatory Visit: Payer: Self-pay | Admitting: Obstetrics & Gynecology

## 2016-02-14 ENCOUNTER — Telehealth: Payer: Self-pay | Admitting: Obstetrics & Gynecology

## 2016-02-14 MED ORDER — NORETHIN-ETH ESTRAD-FE BIPHAS 1 MG-10 MCG / 10 MCG PO TABS: 1.0000 | ORAL_TABLET | Freq: Every day | ORAL | 11 refills | Status: DC

## 2016-02-14 NOTE — Telephone Encounter (Signed)
Patient called stating she was prescribed desogestrel-ethinyl estradiol in May and has been taking it but is having nausea. She has tried to give it a chance to work but wants to switch back to Lo Lo estrin. Please advise. She does have an active mychart account if you need to communicate with her.

## 2016-02-26 ENCOUNTER — Encounter (INDEPENDENT_AMBULATORY_CARE_PROVIDER_SITE_OTHER): Payer: Self-pay

## 2016-02-26 ENCOUNTER — Ambulatory Visit (INDEPENDENT_AMBULATORY_CARE_PROVIDER_SITE_OTHER): Payer: Medicaid Other | Admitting: Adult Health

## 2016-02-26 ENCOUNTER — Encounter: Payer: Self-pay | Admitting: Adult Health

## 2016-02-26 VITALS — BP 120/60 | HR 86 | Ht 66.0 in | Wt 156.0 lb

## 2016-02-26 DIAGNOSIS — O3680X0 Pregnancy with inconclusive fetal viability, not applicable or unspecified: Secondary | ICD-10-CM

## 2016-02-26 DIAGNOSIS — R112 Nausea with vomiting, unspecified: Secondary | ICD-10-CM

## 2016-02-26 DIAGNOSIS — N926 Irregular menstruation, unspecified: Secondary | ICD-10-CM

## 2016-02-26 DIAGNOSIS — Z3490 Encounter for supervision of normal pregnancy, unspecified, unspecified trimester: Secondary | ICD-10-CM

## 2016-02-26 DIAGNOSIS — O219 Vomiting of pregnancy, unspecified: Secondary | ICD-10-CM

## 2016-02-26 DIAGNOSIS — Z3201 Encounter for pregnancy test, result positive: Secondary | ICD-10-CM

## 2016-02-26 LAB — POCT URINE PREGNANCY: Preg Test, Ur: POSITIVE — AB

## 2016-02-26 MED ORDER — PROMETHAZINE HCL 25 MG PO TABS: 25.0000 mg | ORAL_TABLET | Freq: Four times a day (QID) | ORAL | 1 refills | Status: DC | PRN

## 2016-02-26 MED ORDER — OB COMPLETE PETITE 35-5-1-200 MG PO CAPS: ORAL_CAPSULE | ORAL | 12 refills | Status: DC

## 2016-02-26 NOTE — Patient Instructions (Signed)
First Trimester of Pregnancy  The first trimester of pregnancy is from week 1 until the end of week 12 (months 1 through 3). A week after a sperm fertilizes an egg, the egg will implant on the wall of the uterus. This embryo will begin to develop into a baby. Genes from you and your partner are forming the baby. The female genes determine whether the baby is a boy or a girl. At 6-8 weeks, the eyes and face are formed, and the heartbeat can be seen on ultrasound. At the end of 12 weeks, all the baby's organs are formed.   Now that you are pregnant, you will want to do everything you can to have a healthy baby. Two of the most important things are to get good prenatal care and to follow your health care provider's instructions. Prenatal care is all the medical care you receive before the baby's birth. This care will help prevent, find, and treat any problems during the pregnancy and childbirth.  BODY CHANGES  Your body goes through many changes during pregnancy. The changes vary from woman to woman.   · You may gain or lose a couple of pounds at first.  · You may feel sick to your stomach (nauseous) and throw up (vomit). If the vomiting is uncontrollable, call your health care provider.  · You may tire easily.  · You may develop headaches that can be relieved by medicines approved by your health care provider.  · You may urinate more often. Painful urination may mean you have a bladder infection.  · You may develop heartburn as a result of your pregnancy.  · You may develop constipation because certain hormones are causing the muscles that push waste through your intestines to slow down.  · You may develop hemorrhoids or swollen, bulging veins (varicose veins).  · Your breasts may begin to grow larger and become tender. Your nipples may stick out more, and the tissue that surrounds them (areola) may become darker.  · Your gums may bleed and may be sensitive to brushing and flossing.   · Dark spots or blotches (chloasma, mask of pregnancy) may develop on your face. This will likely fade after the baby is born.  · Your menstrual periods will stop.  · You may have a loss of appetite.  · You may develop cravings for certain kinds of food.  · You may have changes in your emotions from day to day, such as being excited to be pregnant or being concerned that something may go wrong with the pregnancy and baby.  · You may have more vivid and strange dreams.  · You may have changes in your hair. These can include thickening of your hair, rapid growth, and changes in texture. Some women also have hair loss during or after pregnancy, or hair that feels dry or thin. Your hair will most likely return to normal after your baby is born.  WHAT TO EXPECT AT YOUR PRENATAL VISITS  During a routine prenatal visit:  · You will be weighed to make sure you and the baby are growing normally.  · Your blood pressure will be taken.  · Your abdomen will be measured to track your baby's growth.  · The fetal heartbeat will be listened to starting around week 10 or 12 of your pregnancy.  · Test results from any previous visits will be discussed.  Your health care provider may ask you:  · How you are feeling.  · If you   are feeling the baby move.  · If you have had any abnormal symptoms, such as leaking fluid, bleeding, severe headaches, or abdominal cramping.  · If you are using any tobacco products, including cigarettes, chewing tobacco, and electronic cigarettes.  · If you have any questions.  Other tests that may be performed during your first trimester include:  · Blood tests to find your blood type and to check for the presence of any previous infections. They will also be used to check for low iron levels (anemia) and Rh antibodies. Later in the pregnancy, blood tests for diabetes will be done along with other tests if problems develop.  · Urine tests to check for infections, diabetes, or protein in the urine.   · An ultrasound to confirm the proper growth and development of the baby.  · An amniocentesis to check for possible genetic problems.  · Fetal screens for spina bifida and Down syndrome.  · You may need other tests to make sure you and the baby are doing well.  · HIV (human immunodeficiency virus) testing. Routine prenatal testing includes screening for HIV, unless you choose not to have this test.  HOME CARE INSTRUCTIONS   Medicines  · Follow your health care provider's instructions regarding medicine use. Specific medicines may be either safe or unsafe to take during pregnancy.  · Take your prenatal vitamins as directed.  · If you develop constipation, try taking a stool softener if your health care provider approves.  Diet  · Eat regular, well-balanced meals. Choose a variety of foods, such as meat or vegetable-based protein, fish, milk and low-fat dairy products, vegetables, fruits, and whole grain breads and cereals. Your health care provider will help you determine the amount of weight gain that is right for you.  · Avoid raw meat and uncooked cheese. These carry germs that can cause birth defects in the baby.  · Eating four or five small meals rather than three large meals a day may help relieve nausea and vomiting. If you start to feel nauseous, eating a few soda crackers can be helpful. Drinking liquids between meals instead of during meals also seems to help nausea and vomiting.  · If you develop constipation, eat more high-fiber foods, such as fresh vegetables or fruit and whole grains. Drink enough fluids to keep your urine clear or pale yellow.  Activity and Exercise  · Exercise only as directed by your health care provider. Exercising will help you:    Control your weight.    Stay in shape.    Be prepared for labor and delivery.  · Experiencing pain or cramping in the lower abdomen or low back is a good sign that you should stop exercising. Check with your health care provider  before continuing normal exercises.  · Try to avoid standing for long periods of time. Move your legs often if you must stand in one place for a long time.  · Avoid heavy lifting.  · Wear low-heeled shoes, and practice good posture.  · You may continue to have sex unless your health care provider directs you otherwise.  Relief of Pain or Discomfort  · Wear a good support bra for breast tenderness.    · Take warm sitz baths to soothe any pain or discomfort caused by hemorrhoids. Use hemorrhoid cream if your health care provider approves.    · Rest with your legs elevated if you have leg cramps or low back pain.  · If you develop varicose veins in your   legs, wear support hose. Elevate your feet for 15 minutes, 3-4 times a day. Limit salt in your diet.  Prenatal Care  · Schedule your prenatal visits by the twelfth week of pregnancy. They are usually scheduled monthly at first, then more often in the last 2 months before delivery.  · Write down your questions. Take them to your prenatal visits.  · Keep all your prenatal visits as directed by your health care provider.  Safety  · Wear your seat belt at all times when driving.  · Make a list of emergency phone numbers, including numbers for family, friends, the hospital, and police and fire departments.  General Tips  · Ask your health care provider for a referral to a local prenatal education class. Begin classes no later than at the beginning of month 6 of your pregnancy.  · Ask for help if you have counseling or nutritional needs during pregnancy. Your health care provider can offer advice or refer you to specialists for help with various needs.  · Do not use hot tubs, steam rooms, or saunas.  · Do not douche or use tampons or scented sanitary pads.  · Do not cross your legs for long periods of time.  · Avoid cat litter boxes and soil used by cats. These carry germs that can cause birth defects in the baby and possibly loss of the fetus by miscarriage or stillbirth.   · Avoid all smoking, herbs, alcohol, and medicines not prescribed by your health care provider. Chemicals in these affect the formation and growth of the baby.  · Do not use any tobacco products, including cigarettes, chewing tobacco, and electronic cigarettes. If you need help quitting, ask your health care provider. You may receive counseling support and other resources to help you quit.  · Schedule a dentist appointment. At home, brush your teeth with a soft toothbrush and be gentle when you floss.  SEEK MEDICAL CARE IF:   · You have dizziness.  · You have mild pelvic cramps, pelvic pressure, or nagging pain in the abdominal area.  · You have persistent nausea, vomiting, or diarrhea.  · You have a bad smelling vaginal discharge.  · You have pain with urination.  · You notice increased swelling in your face, hands, legs, or ankles.  SEEK IMMEDIATE MEDICAL CARE IF:   · You have a fever.  · You are leaking fluid from your vagina.  · You have spotting or bleeding from your vagina.  · You have severe abdominal cramping or pain.  · You have rapid weight gain or loss.  · You vomit blood or material that looks like coffee grounds.  · You are exposed to German measles and have never had them.  · You are exposed to fifth disease or chickenpox.  · You develop a severe headache.  · You have shortness of breath.  · You have any kind of trauma, such as from a fall or a car accident.     This information is not intended to replace advice given to you by your health care provider. Make sure you discuss any questions you have with your health care provider.     Document Released: 02/18/2001 Document Revised: 03/17/2014 Document Reviewed: 01/04/2013  Elsevier Interactive Patient Education ©2017 [employer]

## 2016-02-26 NOTE — Progress Notes (Signed)
Subjective:     Patient ID: Alyssa Massey, female   DOB: 08-08-1985, 30 y.o.   MRN: [id number]  HPI Alyssa Massey is a 30 year old white female in for UPT, has had 3+HPT after missing a period and has nausea daily and some vomiting.   Review of Systems +missed period +nauesa and vomiting Reviewed past medical,surgical, social and family history. Reviewed medications and allergies.     Objective:   Physical Exam BP 120/60 (BP Location: Left Arm, Patient Position: Sitting, Cuff Size: Normal)   Pulse 86   Ht 5\' 6"  (1.676 m)   Wt 156 lb (70.8 kg)   LMP 12/30/2015 (Approximate)   BMI 25.18 kg/m    UPT +, about 8+2 weeks by LMP with EDD 10/05/16, Skin warm and dry. Neck: mid line trachea, normal thyroid, good ROM, no lymphadenopathy noted. Lungs: clear to ausculation bilaterally. Cardiovascular: regular rate and rhythm.Abdomen is soft and non tender, US shows IUP about 8 weeks with +FHM, rate 165. PHQ 2 score 1. Decrease smoking.  Assessment:        1. Pregnancy examination or test, positive result   2. Pregnancy, unspecified gestational age   323. Encounter to determine fetal viability of pregnancy, single or unspecified fetus   4. Nausea and vomiting during pregnancy prior to [redacted] weeks gestation    Plan:     Meds ordered this encounter  Medications  . Prenat-FeCbn-FeAspGl-FA-Omega (OB COMPLETE PETITE) 35-5-1-200 MG CAPS    Sig: Take daily    Dispense:  30 capsule    Refill:  12    Order Specific Question:   Supervising Provider    Answer:   Despina HiddenEURE, LUTHER H [[id number]]  . promethazine (PHENERGAN) 25 MG tablet    Sig: Take 1 tablet (25 mg total) by mouth every 6 (six) hours as needed for nausea or vomiting.    Dispense:  30 tablet    Refill:  1    Order Specific Question:   Supervising Provider    Answer:   Duane LopeEURE, LUTHER H [[id number]]  Return in 3 weeks for dating US and intake Review handout on first trimester

## 2016-03-10 NOTE — L&D Delivery Note (Signed)
Delivery Note At 1:59 AM a viable female was delivered via waterbirth (Presentation:LOA). Luna KitchensKathryn Kooistra, CNM present to assist. APGAR: 8, 9; weight  pending .   Placenta status: complete, intact .  Cord:  with the following complications: .  Cord pH: NA  Anesthesia:  None  Episiotomy:  None  Lacerations:  None  Suture Repair: NA Est. Blood Loss (mL):  250 cc  Mom to postpartum.  Baby to Couplet care / Skin to Skin.  Thressa ShellerHeather Giannina Bartolome 10/01/2016, 2:17 AM

## 2016-03-18 ENCOUNTER — Ambulatory Visit (INDEPENDENT_AMBULATORY_CARE_PROVIDER_SITE_OTHER): Payer: Medicaid Other | Admitting: *Deleted

## 2016-03-18 ENCOUNTER — Encounter: Payer: Self-pay | Admitting: *Deleted

## 2016-03-18 ENCOUNTER — Ambulatory Visit (INDEPENDENT_AMBULATORY_CARE_PROVIDER_SITE_OTHER): Payer: Medicaid Other

## 2016-03-18 VITALS — BP 123/71 | HR 91 | Wt 158.0 lb

## 2016-03-18 DIAGNOSIS — Z3A11 11 weeks gestation of pregnancy: Secondary | ICD-10-CM

## 2016-03-18 DIAGNOSIS — Z1389 Encounter for screening for other disorder: Secondary | ICD-10-CM | POA: Diagnosis not present

## 2016-03-18 DIAGNOSIS — Z331 Pregnant state, incidental: Secondary | ICD-10-CM

## 2016-03-18 DIAGNOSIS — F17200 Nicotine dependence, unspecified, uncomplicated: Secondary | ICD-10-CM

## 2016-03-18 DIAGNOSIS — O99331 Smoking (tobacco) complicating pregnancy, first trimester: Secondary | ICD-10-CM | POA: Diagnosis not present

## 2016-03-18 DIAGNOSIS — O3680X0 Pregnancy with inconclusive fetal viability, not applicable or unspecified: Secondary | ICD-10-CM | POA: Diagnosis not present

## 2016-03-18 DIAGNOSIS — Z3481 Encounter for supervision of other normal pregnancy, first trimester: Secondary | ICD-10-CM | POA: Diagnosis not present

## 2016-03-18 DIAGNOSIS — Z3682 Encounter for antenatal screening for nuchal translucency: Secondary | ICD-10-CM

## 2016-03-18 DIAGNOSIS — Z3490 Encounter for supervision of normal pregnancy, unspecified, unspecified trimester: Secondary | ICD-10-CM | POA: Insufficient documentation

## 2016-03-18 LAB — POCT URINALYSIS DIPSTICK
Blood, UA: NEGATIVE
Glucose, UA: NEGATIVE
KETONES UA: NEGATIVE
LEUKOCYTES UA: NEGATIVE
Nitrite, UA: NEGATIVE
Protein, UA: NEGATIVE

## 2016-03-18 NOTE — Progress Notes (Signed)
US 11+2 wks,single IUP,pos fht 173 bpm,normal ov's bilat,crl 46.5 mm,EDD 10/05/2016 by LMP

## 2016-03-18 NOTE — Progress Notes (Signed)
Alyssa Massey is a 31 y.o. [phone] female here today for initial OB intake/educational visit with RN  Patient's medical, surgical, and obstetrical history obtained and reviewed.  Current medications and allergies also reviewed.   Dating ultrasound today revealed GA of [redacted]wk2d based on LMP. EDC 10/05/16   BP 123/71   Pulse 91   Wt 158 lb (71.7 kg)   LMP 12/30/2015 (Approximate)   BMI 25.50 kg/m   There are no active problems to display for this patient.  Past Medical History:  Diagnosis Date  . Medical history non-contributory    Past Surgical History:  Procedure Laterality Date  . NO PAST SURGERIES     OB History    Gravida Para Term Preterm AB Living   3 2 2  0 0 2   SAB TAB Ectopic Multiple Live Births   0 0 0 0 2      She is taking prenatal vitamins CCNC form completed PN1 labs drawn Baby scripts and mychart activated  Reviewed recommended weight gain based on pre-gravid BMI Currently smokes 0.5ppd but states she is going to try to quit on her own.   Genetic Screening discussed First Screen: requested Cystic fibrosis screening discussed declined  Face-to-face time at least 30 minutes. 50% or more of this visit was spent in counseling and coordination of care.  Return in about 1 week (around 03/25/2016) for US;NT+1stIT, New OB.   Debbe OdeaLatisha Cresenzo RN-C 03/18/2016 12:24 PM

## 2016-03-19 ENCOUNTER — Encounter: Payer: Self-pay | Admitting: Women's Health

## 2016-03-19 DIAGNOSIS — F1290 Cannabis use, unspecified, uncomplicated: Secondary | ICD-10-CM | POA: Insufficient documentation

## 2016-03-19 DIAGNOSIS — Z6791 Unspecified blood type, Rh negative: Secondary | ICD-10-CM | POA: Insufficient documentation

## 2016-03-19 DIAGNOSIS — O26899 Other specified pregnancy related conditions, unspecified trimester: Secondary | ICD-10-CM

## 2016-03-20 ENCOUNTER — Encounter: Payer: Self-pay | Admitting: Women's Health

## 2016-03-20 ENCOUNTER — Other Ambulatory Visit: Payer: Self-pay | Admitting: Women's Health

## 2016-03-20 ENCOUNTER — Telehealth: Payer: Self-pay | Admitting: *Deleted

## 2016-03-20 DIAGNOSIS — R8271 Bacteriuria: Secondary | ICD-10-CM | POA: Insufficient documentation

## 2016-03-20 LAB — URINALYSIS, ROUTINE W REFLEX MICROSCOPIC
BILIRUBIN UA: NEGATIVE
Glucose, UA: NEGATIVE
Ketones, UA: NEGATIVE
LEUKOCYTES UA: NEGATIVE
Nitrite, UA: NEGATIVE
PH UA: 5.5 (ref 5.0–7.5)
Protein, UA: NEGATIVE
RBC UA: NEGATIVE
SPEC GRAV UA: 1.015 (ref 1.005–1.030)
Urobilinogen, Ur: 0.2 mg/dL (ref 0.2–1.0)

## 2016-03-20 LAB — RUBELLA SCREEN: Rubella Antibodies, IGG: 1.8 index (ref >0.99)

## 2016-03-20 LAB — GC/CHLAMYDIA PROBE AMP
CHLAMYDIA, DNA PROBE: NEGATIVE
Neisseria gonorrhoeae by PCR: NEGATIVE

## 2016-03-20 LAB — HIV ANTIBODY (ROUTINE TESTING W REFLEX): HIV SCREEN 4TH GENERATION: NONREACTIVE

## 2016-03-20 LAB — PMP SCREEN PROFILE (10S), URINE
Amphetamine Screen, Ur: NEGATIVE ng/mL
Barbiturate Screen, Ur: NEGATIVE ng/mL
Benzodiazepine Screen, Urine: NEGATIVE ng/mL
CANNABINOIDS UR QL SCN: POSITIVE ng/mL
COCAINE(METAB.) SCREEN, URINE: NEGATIVE ng/mL
Creatinine(Crt), U: 78.7 mg/dL (ref 20.0–300.0)
METHADONE SCREEN, URINE: NEGATIVE ng/mL
OPIATE SCRN UR: NEGATIVE ng/mL
OXYCODONE+OXYMORPHONE UR QL SCN: NEGATIVE ng/mL
PCP SCRN UR: NEGATIVE ng/mL
PROPOXYPHENE SCREEN: NEGATIVE ng/mL
Ph of Urine: 5.5 (ref 4.5–8.9)

## 2016-03-20 LAB — ANTIBODY SCREEN: ANTIBODY SCREEN: NEGATIVE

## 2016-03-20 LAB — VARICELLA ZOSTER ANTIBODY, IGG: Varicella zoster IgG: 572 index (ref >165)

## 2016-03-20 LAB — URINE CULTURE

## 2016-03-20 LAB — CBC
HEMATOCRIT: 42.2 % (ref 34.0–46.6)
HEMOGLOBIN: 14.1 g/dL (ref 11.1–15.9)
MCH: 29.6 pg (ref 26.6–33.0)
MCHC: 33.4 g/dL (ref 31.5–35.7)
MCV: 89 fL (ref 79–97)
Platelets: 212 10*3/uL (ref 150–379)
RBC: 4.77 x10E6/uL (ref 3.77–5.28)
RDW: 13.5 % (ref 12.3–15.4)
WBC: 9.4 10*3/uL (ref 3.4–10.8)

## 2016-03-20 LAB — HEPATITIS B SURFACE ANTIGEN: HEP B S AG: NEGATIVE

## 2016-03-20 LAB — ABO/RH: RH TYPE: NEGATIVE

## 2016-03-20 LAB — RPR: RPR: NONREACTIVE

## 2016-03-20 MED ORDER — AMOXICILLIN 500 MG PO CAPS: 500.0000 mg | ORAL_CAPSULE | Freq: Two times a day (BID) | ORAL | 0 refills | Status: DC

## 2016-03-21 ENCOUNTER — Encounter: Payer: Self-pay | Admitting: Obstetrics & Gynecology

## 2016-03-24 ENCOUNTER — Other Ambulatory Visit: Payer: Medicaid Other

## 2016-03-24 ENCOUNTER — Ambulatory Visit (INDEPENDENT_AMBULATORY_CARE_PROVIDER_SITE_OTHER): Payer: Medicaid Other

## 2016-03-24 DIAGNOSIS — Z3682 Encounter for antenatal screening for nuchal translucency: Secondary | ICD-10-CM

## 2016-03-24 DIAGNOSIS — Z3A12 12 weeks gestation of pregnancy: Secondary | ICD-10-CM | POA: Diagnosis not present

## 2016-03-24 DIAGNOSIS — F17200 Nicotine dependence, unspecified, uncomplicated: Secondary | ICD-10-CM

## 2016-03-24 DIAGNOSIS — Z3481 Encounter for supervision of other normal pregnancy, first trimester: Secondary | ICD-10-CM

## 2016-03-24 NOTE — Telephone Encounter (Signed)
Pt informed of UTI and ABX sent to pharmacy. 

## 2016-03-24 NOTE — Progress Notes (Signed)
US 12+1 wks,measurements c/w dates,crl 56.3 mm,NB present,NT 1.1 mm,normal ov's bilat,fhr 157 bpm

## 2016-03-31 ENCOUNTER — Encounter: Payer: Self-pay | Admitting: Women's Health

## 2016-03-31 ENCOUNTER — Other Ambulatory Visit: Payer: Medicaid Other

## 2016-03-31 ENCOUNTER — Ambulatory Visit (INDEPENDENT_AMBULATORY_CARE_PROVIDER_SITE_OTHER): Payer: Medicaid Other | Admitting: Women's Health

## 2016-03-31 ENCOUNTER — Other Ambulatory Visit: Payer: Self-pay | Admitting: Women's Health

## 2016-03-31 VITALS — BP 133/74 | HR 88 | Wt 159.0 lb

## 2016-03-31 DIAGNOSIS — O26891 Other specified pregnancy related conditions, first trimester: Secondary | ICD-10-CM

## 2016-03-31 DIAGNOSIS — Z331 Pregnant state, incidental: Secondary | ICD-10-CM

## 2016-03-31 DIAGNOSIS — Z3A13 13 weeks gestation of pregnancy: Secondary | ICD-10-CM

## 2016-03-31 DIAGNOSIS — O99321 Drug use complicating pregnancy, first trimester: Secondary | ICD-10-CM | POA: Diagnosis not present

## 2016-03-31 DIAGNOSIS — N898 Other specified noninflammatory disorders of vagina: Secondary | ICD-10-CM

## 2016-03-31 DIAGNOSIS — Z1389 Encounter for screening for other disorder: Secondary | ICD-10-CM | POA: Diagnosis not present

## 2016-03-31 DIAGNOSIS — O99331 Smoking (tobacco) complicating pregnancy, first trimester: Secondary | ICD-10-CM | POA: Diagnosis not present

## 2016-03-31 DIAGNOSIS — Z3481 Encounter for supervision of other normal pregnancy, first trimester: Secondary | ICD-10-CM

## 2016-03-31 DIAGNOSIS — O2341 Unspecified infection of urinary tract in pregnancy, first trimester: Secondary | ICD-10-CM | POA: Diagnosis not present

## 2016-03-31 LAB — POCT WET PREP (WET MOUNT)
Clue Cells Wet Prep Whiff POC: NEGATIVE
Trichomonas Wet Prep HPF POC: ABSENT

## 2016-03-31 LAB — POCT URINALYSIS DIPSTICK
Blood, UA: NEGATIVE
GLUCOSE UA: NEGATIVE
Ketones, UA: NEGATIVE
LEUKOCYTES UA: NEGATIVE
NITRITE UA: NEGATIVE
PROTEIN UA: NEGATIVE

## 2016-03-31 NOTE — Patient Instructions (Signed)
Monistat 7 for yeast  Second Trimester of Pregnancy The second trimester is from week 13 through week 28 (months 4 through 6). The second trimester is often a time when you feel your best. Your body has also adjusted to being pregnant, and you begin to feel better physically. Usually, morning sickness has lessened or quit completely, you may have more energy, and you may have an increase in appetite. The second trimester is also a time when the fetus is growing rapidly. At the end of the sixth month, the fetus is about 9 inches long and weighs about 1 pounds. You will likely begin to feel the baby move (quickening) between 18 and 20 weeks of the pregnancy. Body changes during your second trimester Your body continues to go through many changes during your second trimester. The changes vary from woman to woman.  Your weight will continue to increase. You will notice your lower abdomen bulging out.  You may begin to get stretch marks on your hips, abdomen, and breasts.  You may develop headaches that can be relieved by medicines. The medicines should be approved by your health care provider.  You may urinate more often because the fetus is pressing on your bladder.  You may develop or continue to have heartburn as a result of your pregnancy.  You may develop constipation because certain hormones are causing the muscles that push waste through your intestines to slow down.  You may develop hemorrhoids or swollen, bulging veins (varicose veins).  You may have back pain. This is caused by:  Weight gain.  Pregnancy hormones that are relaxing the joints in your pelvis.  A shift in weight and the muscles that support your balance.  Your breasts will continue to grow and they will continue to become tender.  Your gums may bleed and may be sensitive to brushing and flossing.  Dark spots or blotches (chloasma, mask of pregnancy) may develop on your face. This will likely fade after the baby is  born.  A dark line from your belly button to the pubic area (linea nigra) may appear. This will likely fade after the baby is born.  You may have changes in your hair. These can include thickening of your hair, rapid growth, and changes in texture. Some women also have hair loss during or after pregnancy, or hair that feels dry or thin. Your hair will most likely return to normal after your baby is born. What to expect at prenatal visits During a routine prenatal visit:  You will be weighed to make sure you and the fetus are growing normally.  Your blood pressure will be taken.  Your abdomen will be measured to track your baby's growth.  The fetal heartbeat will be listened to.  Any test results from the previous visit will be discussed. Your health care provider may ask you:  How you are feeling.  If you are feeling the baby move.  If you have had any abnormal symptoms, such as leaking fluid, bleeding, severe headaches, or abdominal cramping.  If you are using any tobacco products, including cigarettes, chewing tobacco, and electronic cigarettes.  If you have any questions. Other tests that may be performed during your second trimester include:  Blood tests that check for:  Low iron levels (anemia).  Gestational diabetes (between 24 and 28 weeks).  Rh antibodies. This is to check for a protein on red blood cells (Rh factor).  Urine tests to check for infections, diabetes, or protein in the  urine.  An ultrasound to confirm the proper growth and development of the baby.  An amniocentesis to check for possible genetic problems.  Fetal screens for spina bifida and Down syndrome.  HIV (human immunodeficiency virus) testing. Routine prenatal testing includes screening for HIV, unless you choose not to have this test. Follow these instructions at home: Eating and drinking  Continue to eat regular, healthy meals.  Avoid raw meat, uncooked cheese, cat litter boxes, and  soil used by cats. These carry germs that can cause birth defects in the baby.  Take your prenatal vitamins.  Take 1500-2000 mg of calcium daily starting at the 20th week of pregnancy until you deliver your baby.  If you develop constipation:  Take over-the-counter or prescription medicines.  Drink enough fluid to keep your urine clear or pale yellow.  Eat foods that are high in fiber, such as fresh fruits and vegetables, whole grains, and beans.  Limit foods that are high in fat and processed sugars, such as fried and sweet foods. Activity  Exercise only as directed by your health care provider. Experiencing uterine cramps is a good sign to stop exercising.  Avoid heavy lifting, wear low heel shoes, and practice good posture.  Wear your seat belt at all times when driving.  Rest with your legs elevated if you have leg cramps or low back pain.  Wear a good support bra for breast tenderness.  Do not use hot tubs, steam rooms, or saunas. Lifestyle  Avoid all smoking, herbs, alcohol, and unprescribed drugs. These chemicals affect the formation and growth of the baby.  Do not use any products that contain nicotine or tobacco, such as cigarettes and e-cigarettes. If you need help quitting, ask your health care provider.  A sexual relationship may be continued unless your health care provider directs you otherwise. General instructions  Follow your health care provider's instructions regarding medicine use. There are medicines that are either safe or unsafe to take during pregnancy.  Take warm sitz baths to soothe any pain or discomfort caused by hemorrhoids. Use hemorrhoid cream if your health care provider approves.  If you develop varicose veins, wear support hose. Elevate your feet for 15 minutes, 3-4 times a day. Limit salt in your diet.  Visit your dentist if you have not gone yet during your pregnancy. Use a soft toothbrush to brush your teeth and be gentle when you  floss.  Keep all follow-up prenatal visits as told by your health care provider. This is important. Contact a health care provider if:  You have dizziness.  You have mild pelvic cramps, pelvic pressure, or nagging pain in the abdominal area.  You have persistent nausea, vomiting, or diarrhea.  You have a bad smelling vaginal discharge.  You have pain with urination. Get help right away if:  You have a fever.  You are leaking fluid from your vagina.  You have spotting or bleeding from your vagina.  You have severe abdominal cramping or pain.  You have rapid weight gain or weight loss.  You have shortness of breath with chest pain.  You notice sudden or extreme swelling of your face, hands, ankles, feet, or legs.  You have not felt your baby move in over an hour.  You have severe headaches that do not go away with medicine.  You have vision changes. Summary  The second trimester is from week 13 through week 28 (months 4 through 6). It is also a time when the fetus is growing  rapidly.  Your body goes through many changes during pregnancy. The changes vary from woman to woman.  Avoid all smoking, herbs, alcohol, and unprescribed drugs. These chemicals affect the formation and growth your baby.  Do not use any tobacco products, such as cigarettes, chewing tobacco, and e-cigarettes. If you need help quitting, ask your health care provider.  Contact your health care provider if you have any questions. Keep all prenatal visits as told by your health care provider. This is important. This information is not intended to replace advice given to you by your health care provider. Make sure you discuss any questions you have with your health care provider. Document Released: 02/18/2001 Document Revised: 08/02/2015 Document Reviewed: 04/27/2012 Elsevier Interactive Patient Education  2017 [employer].

## 2016-03-31 NOTE — Progress Notes (Signed)
Subjective:  Alyssa Massey is a 31 y.o. 783P2002 Caucasian female at 6529w1d by LMP c/w 11wk u/s, being seen today for her first obstetrical visit.  She had her intake visit w/ Maury Dusish Cresenzo, RN 03/18/16. Her obstetrical history is significant for term uncomplicated svb x 2, gbs+uti at intake visit, still has about 3d left of her antibiotic, smoker, +THC.  Pregnancy history fully reviewed.  Patient reports white d/c w/ irritation x 2-3d. Denies vb, cramping, uti s/s, abnormal/malodorous vag d/c, or vulvovaginal itching/irritation.  BP 133/74   Pulse 88   Wt 159 lb (72.1 kg)   LMP 12/30/2015 (Approximate)   BMI 25.66 kg/m   HISTORY: OB History  Gravida Para Term Preterm AB Living  3 2 2  0 0 2  SAB TAB Ectopic Multiple Live Births  0 0 0 0 2    # Outcome Date GA Lbr Len/2nd Weight Sex Delivery Anes PTL Lv  3 Current           2 Term 09/10/10 860w4d  8 lb 1 oz (3.657 kg) F Vag-Spont EPI N LIV  1 Term 12/03/07 7236w2d  8 lb 3 oz (3.714 kg) M Vag-Spont EPI N LIV     Past Medical History:  Diagnosis Date  . Medical history non-contributory    Past Surgical History:  Procedure Laterality Date  . NO PAST SURGERIES     Family History  Problem Relation Age of Onset  . Other Mother     3 blockages in heart  . Alcohol abuse Father   . Cancer Paternal Grandfather   . Diabetes Paternal Grandmother   . Hypertension Paternal Grandmother   . Other Maternal Grandmother     brain tumor  . Alzheimer's disease Maternal Grandfather     Exam   System:     General: Well developed & nourished, no acute distress   Skin: Warm & dry, normal coloration and turgor, no rashes   Neurologic: Alert & oriented, normal mood   Cardiovascular: Regular rate & rhythm   Respiratory: Effort & rate normal, LCTAB, acyanotic   Abdomen: Soft, non tender   Extremities: normal strength, tone   Pelvic Exam:    Perineum: Normal perineum   Vulva: Normal, no lesions   Vagina:  Normal mucosa, white d/c, no odor    Cervix: Normal, bulbous, appears closed   Uterus: Normal size/shape/contour for GA   Thin prep pap smear neg 07/31/15  FHR: 157 via doppler Results for orders placed or performed in visit on 03/31/16 (from the past 24 hour(s))  POCT urinalysis dipstick     Status: None   Collection Time: 03/31/16 11:00 AM  Result Value Ref Range   Color, UA     Clarity, UA     Glucose, UA neg    Bilirubin, UA     Ketones, UA neg    Spec Grav, UA     Blood, UA neg    pH, UA     Protein, UA neg    Urobilinogen, UA     Nitrite, UA neg    Leukocytes, UA Negative Negative  POCT Wet Prep Mellody Drown([place]et [place])     Status: Abnormal   Collection Time: 03/31/16 11:32 AM  Result Value Ref Range   Source Wet Prep POC vaginal    WBC, Wet Prep HPF POC few    Bacteria Wet Prep HPF POC None (A) Few   BACTERIA WET PREP MORPHOLOGY POC     Clue Cells Wet Prep HPF POC  None None   Clue Cells Wet Prep Whiff POC Negative Whiff    Yeast Wet Prep HPF POC Few    KOH Wet Prep POC     Trichomonas Wet Prep HPF POC Absent Absent      Assessment:   Pregnancy: [id number] Patient Active Problem List   Diagnosis Date Noted  . GBS bacteriuria 03/20/2016  . Rh negative state in antepartum period 03/19/2016  . Marijuana use 03/19/2016  . Smoker 03/18/2016  . Encounter for supervision of other normal pregnancy 03/18/2016    [redacted]w[redacted]d G3P2002 New OB visit Yeast GBS UTI Smoker  +THC  Plan:  Initial labs obtained at intake visit Continue prenatal vitamins Problem list reviewed and updated Reviewed n/v relief measures and warning s/s to report Reviewed recommended weight gain based on pre-gravid BMI Encouraged well-balanced diet Genetic Screening discussed Integrated Screen: had 1st it/nt 1/15 Cystic fibrosis screening discussed declined Ultrasound discussed; fetal survey: requested Follow up in 3 weeks for visit and 2nd IT CCNC completed at intake visit OTC monistat 7 for yeast Will do GBS poc next visit Declined flu  shot  Marge Duncans CNM, [facility] 03/31/2016 11:25 AM

## 2016-04-02 LAB — MATERNAL SCREEN, INTEGRATED #1
Crown Rump Length: 56.8 mm
GEST. AGE ON COLLECTION DATE: 12.3 wk
Maternal Age at EDD: 30.9 years
NUCHAL TRANSLUCENCY (NT): 1.1 mm
Number of Fetuses: 1
PAPP-A VALUE: 1366.5 ng/mL
WEIGHT: 158 [lb_av]

## 2016-04-02 LAB — URINE CULTURE

## 2016-04-03 ENCOUNTER — Other Ambulatory Visit: Payer: Self-pay | Admitting: Women's Health

## 2016-04-03 ENCOUNTER — Telehealth: Payer: Self-pay | Admitting: *Deleted

## 2016-04-03 DIAGNOSIS — O2340 Unspecified infection of urinary tract in pregnancy, unspecified trimester: Secondary | ICD-10-CM | POA: Insufficient documentation

## 2016-04-03 DIAGNOSIS — O2341 Unspecified infection of urinary tract in pregnancy, first trimester: Secondary | ICD-10-CM

## 2016-04-03 DIAGNOSIS — R8271 Bacteriuria: Secondary | ICD-10-CM

## 2016-04-03 MED ORDER — NITROFURANTOIN MONOHYD MACRO 100 MG PO CAPS: 100.0000 mg | ORAL_CAPSULE | Freq: Two times a day (BID) | ORAL | 0 refills | Status: DC

## 2016-04-04 ENCOUNTER — Telehealth: Payer: Self-pay | Admitting: Women's Health

## 2016-04-04 NOTE — Telephone Encounter (Signed)
Called pt to notify of uti, rx sent to pharmacy yesterday. Mailbox full. Will continue trying to contact.  Cheral MarkerKimberly R. Booker, CNM, [facility] 04/04/2016 11:25 AM

## 2016-04-04 NOTE — Telephone Encounter (Signed)
Pt returned call, notified of uti, rx sent to pharmacy, take as directed. POC next visit.  Cheral MarkerKimberly R. Booker, CNM, [facility] 04/04/2016 11:27 AM

## 2016-04-21 ENCOUNTER — Encounter: Payer: Medicaid Other | Admitting: Women's Health

## 2016-04-21 ENCOUNTER — Encounter: Payer: Self-pay | Admitting: *Deleted

## 2016-04-24 ENCOUNTER — Encounter: Payer: Self-pay | Admitting: Advanced Practice Midwife

## 2016-04-24 ENCOUNTER — Ambulatory Visit (INDEPENDENT_AMBULATORY_CARE_PROVIDER_SITE_OTHER): Payer: Medicaid Other | Admitting: Advanced Practice Midwife

## 2016-04-24 VITALS — BP 120/60 | HR 80 | Wt 165.4 lb

## 2016-04-24 DIAGNOSIS — Z1389 Encounter for screening for other disorder: Secondary | ICD-10-CM | POA: Diagnosis not present

## 2016-04-24 DIAGNOSIS — Z3482 Encounter for supervision of other normal pregnancy, second trimester: Secondary | ICD-10-CM

## 2016-04-24 DIAGNOSIS — Z331 Pregnant state, incidental: Secondary | ICD-10-CM | POA: Diagnosis not present

## 2016-04-24 DIAGNOSIS — O0992 Supervision of high risk pregnancy, unspecified, second trimester: Secondary | ICD-10-CM | POA: Diagnosis not present

## 2016-04-24 DIAGNOSIS — Z3A17 17 weeks gestation of pregnancy: Secondary | ICD-10-CM | POA: Diagnosis not present

## 2016-04-24 DIAGNOSIS — O99322 Drug use complicating pregnancy, second trimester: Secondary | ICD-10-CM

## 2016-04-24 DIAGNOSIS — O99332 Smoking (tobacco) complicating pregnancy, second trimester: Secondary | ICD-10-CM | POA: Diagnosis not present

## 2016-04-24 DIAGNOSIS — O2342 Unspecified infection of urinary tract in pregnancy, second trimester: Secondary | ICD-10-CM | POA: Diagnosis not present

## 2016-04-24 DIAGNOSIS — O2341 Unspecified infection of urinary tract in pregnancy, first trimester: Secondary | ICD-10-CM

## 2016-04-24 DIAGNOSIS — Z369 Encounter for antenatal screening, unspecified: Secondary | ICD-10-CM

## 2016-04-24 DIAGNOSIS — Z363 Encounter for antenatal screening for malformations: Secondary | ICD-10-CM

## 2016-04-24 LAB — POCT URINALYSIS DIPSTICK
Blood, UA: NEGATIVE
Glucose, UA: NEGATIVE
KETONES UA: NEGATIVE
Leukocytes, UA: NEGATIVE
Nitrite, UA: NEGATIVE
PROTEIN UA: NEGATIVE

## 2016-04-24 NOTE — Patient Instructions (Signed)
Second Trimester of Pregnancy The second trimester is from week 13 through week 28 (months 4 through 6). The second trimester is often a time when you feel your best. Your body has also adjusted to being pregnant, and you begin to feel better physically. Usually, morning sickness has lessened or quit completely, you may have more energy, and you may have an increase in appetite. The second trimester is also a time when the fetus is growing rapidly. At the end of the sixth month, the fetus is about 9 inches long and weighs about 1 pounds. You will likely begin to feel the baby move (quickening) between 18 and 20 weeks of the pregnancy. Body changes during your second trimester Your body continues to go through many changes during your second trimester. The changes vary from woman to woman.  Your weight will continue to increase. You will notice your lower abdomen bulging out.  You may begin to get stretch marks on your hips, abdomen, and breasts.  You may develop headaches that can be relieved by medicines. The medicines should be approved by your health care provider.  You may urinate more often because the fetus is pressing on your bladder.  You may develop or continue to have heartburn as a result of your pregnancy.  You may develop constipation because certain hormones are causing the muscles that push waste through your intestines to slow down.  You may develop hemorrhoids or swollen, bulging veins (varicose veins).  You may have back pain. This is caused by:  Weight gain.  Pregnancy hormones that are relaxing the joints in your pelvis.  A shift in weight and the muscles that support your balance.  Your breasts will continue to grow and they will continue to become tender.  Your gums may bleed and may be sensitive to brushing and flossing.  Dark spots or blotches (chloasma, mask of pregnancy) may develop on your face. This will likely fade after the baby is born.  A dark line  from your belly button to the pubic area (linea nigra) may appear. This will likely fade after the baby is born.  You may have changes in your hair. These can include thickening of your hair, rapid growth, and changes in texture. Some women also have hair loss during or after pregnancy, or hair that feels dry or thin. Your hair will most likely return to normal after your baby is born. What to expect at prenatal visits During a routine prenatal visit:  You will be weighed to make sure you and the fetus are growing normally.  Your blood pressure will be taken.  Your abdomen will be measured to track your baby's growth.  The fetal heartbeat will be listened to.  Any test results from the previous visit will be discussed. Your health care provider may ask you:  How you are feeling.  If you are feeling the baby move.  If you have had any abnormal symptoms, such as leaking fluid, bleeding, severe headaches, or abdominal cramping.  If you are using any tobacco products, including cigarettes, chewing tobacco, and electronic cigarettes.  If you have any questions. Other tests that may be performed during your second trimester include:  Blood tests that check for:  Low iron levels (anemia).  Gestational diabetes (between 24 and 28 weeks).  Rh antibodies. This is to check for a protein on red blood cells (Rh factor).  Urine tests to check for infections, diabetes, or protein in the urine.  An ultrasound to   confirm the proper growth and development of the baby.  An amniocentesis to check for possible genetic problems.  Fetal screens for spina bifida and Down syndrome.  HIV (human immunodeficiency virus) testing. Routine prenatal testing includes screening for HIV, unless you choose not to have this test. Follow these instructions at home: Eating and drinking  Continue to eat regular, healthy meals.  Avoid raw meat, uncooked cheese, cat litter boxes, and soil used by cats. These  carry germs that can cause birth defects in the baby.  Take your prenatal vitamins.  Take 1500-2000 mg of calcium daily starting at the 20th week of pregnancy until you deliver your baby.  If you develop constipation:  Take over-the-counter or prescription medicines.  Drink enough fluid to keep your urine clear or pale yellow.  Eat foods that are high in fiber, such as fresh fruits and vegetables, whole grains, and beans.  Limit foods that are high in fat and processed sugars, such as fried and sweet foods. Activity  Exercise only as directed by your health care provider. Experiencing uterine cramps is a good sign to stop exercising.  Avoid heavy lifting, wear low heel shoes, and practice good posture.  Wear your seat belt at all times when driving.  Rest with your legs elevated if you have leg cramps or low back pain.  Wear a good support bra for breast tenderness.  Do not use hot tubs, steam rooms, or saunas. Lifestyle  Avoid all smoking, herbs, alcohol, and unprescribed drugs. These chemicals affect the formation and growth of the baby.  Do not use any products that contain nicotine or tobacco, such as cigarettes and e-cigarettes. If you need help quitting, ask your health care provider.  A sexual relationship may be continued unless your health care provider directs you otherwise. General instructions  Follow your health care provider's instructions regarding medicine use. There are medicines that are either safe or unsafe to take during pregnancy.  Take warm sitz baths to soothe any pain or discomfort caused by hemorrhoids. Use hemorrhoid cream if your health care provider approves.  If you develop varicose veins, wear support hose. Elevate your feet for 15 minutes, 3-4 times a day. Limit salt in your diet.  Visit your dentist if you have not gone yet during your pregnancy. Use a soft toothbrush to brush your teeth and be gentle when you floss.  Keep all follow-up  prenatal visits as told by your health care provider. This is important. Contact a health care provider if:  You have dizziness.  You have mild pelvic cramps, pelvic pressure, or nagging pain in the abdominal area.  You have persistent nausea, vomiting, or diarrhea.  You have a bad smelling vaginal discharge.  You have pain with urination. Get help right away if:  You have a fever.  You are leaking fluid from your vagina.  You have spotting or bleeding from your vagina.  You have severe abdominal cramping or pain.  You have rapid weight gain or weight loss.  You have shortness of breath with chest pain.  You notice sudden or extreme swelling of your face, hands, ankles, feet, or legs.  You have not felt your baby move in over an hour.  You have severe headaches that do not go away with medicine.  You have vision changes. Summary  The second trimester is from week 13 through week 28 (months 4 through 6). It is also a time when the fetus is growing rapidly.  Your body goes   through many changes during pregnancy. The changes vary from woman to woman.  Avoid all smoking, herbs, alcohol, and unprescribed drugs. These chemicals affect the formation and growth your baby.  Do not use any tobacco products, such as cigarettes, chewing tobacco, and e-cigarettes. If you need help quitting, ask your health care provider.  Contact your health care provider if you have any questions. Keep all prenatal visits as told by your health care provider. This is important. This information is not intended to replace advice given to you by your health care provider. Make sure you discuss any questions you have with your health care provider. Document Released: 02/18/2001 Document Revised: 08/02/2015 Document Reviewed: 04/27/2012 Elsevier Interactive Patient Education  2017 [employer]  

## 2016-04-24 NOTE — Progress Notes (Signed)
[id number] 6840w4d Estimated Date of Delivery: 10/05/16  Blood pressure 120/60, pulse 80, weight 165 lb 6.4 oz (75 kg), last menstrual period 12/30/2015.   BP weight and urine results all reviewed and noted.  Please refer to the obstetrical flow sheet for the fundal height and fetal heart rate documentation:  Patient denies any bleeding and no rupture of membranes symptoms or regular contractions. Patient is without complaints. All questions were answered.  Orders Placed This Encounter  Procedures  . Urine Culture  . Maternal Screen, Integrated #2  . POCT urinalysis dipstick    Plan:  Continued routine obstetrical care, 2nd iT, POC UTI  Return in about 3 weeks (around 05/15/2016) for LROB, AV:[id number]:Anatomy.

## 2016-04-26 LAB — URINE CULTURE

## 2016-04-30 ENCOUNTER — Other Ambulatory Visit: Payer: Self-pay | Admitting: Advanced Practice Midwife

## 2016-04-30 ENCOUNTER — Encounter: Payer: Self-pay | Admitting: Advanced Practice Midwife

## 2016-04-30 LAB — MATERNAL SCREEN, INTEGRATED #2
AFP MARKER: 38 ng/mL
AFP MoM: 1.16
CROWN RUMP LENGTH: 56.8 mm
DIA MOM: 2.46
DIA Value: 413.7 pg/mL
ESTRIOL UNCONJUGATED: 0.87 ng/mL
GESTATIONAL AGE: 16.7 wk
Gest. Age on Collection Date: 12.3 weeks
Maternal Age at EDD: 30.9 years
NUCHAL TRANSLUCENCY MOM: 0.75
Nuchal Translucency (NT): 1.1 mm
Number of Fetuses: 1
PAPP-A MOM: 1.65
PAPP-A VALUE: 1366.5 ng/mL
TEST RESULTS: NEGATIVE
WEIGHT: 158 [lb_av]
Weight: 158 [lb_av]
hCG MoM: 0.72
hCG Value: 20.7 IU/mL
uE3 MoM: 0.93

## 2016-04-30 MED ORDER — AMOXICILLIN 500 MG PO CAPS: 500.0000 mg | ORAL_CAPSULE | Freq: Two times a day (BID) | ORAL | 0 refills | Status: DC

## 2016-04-30 NOTE — Progress Notes (Signed)
GBS in urine again. tx amoxicillin, or offered pt to give a cath specimen to see if it is just vaginal contamination

## 2016-05-16 ENCOUNTER — Other Ambulatory Visit: Payer: Self-pay | Admitting: Advanced Practice Midwife

## 2016-05-16 DIAGNOSIS — Z363 Encounter for antenatal screening for malformations: Secondary | ICD-10-CM

## 2016-05-19 ENCOUNTER — Ambulatory Visit (INDEPENDENT_AMBULATORY_CARE_PROVIDER_SITE_OTHER): Payer: Medicaid Other

## 2016-05-19 ENCOUNTER — Ambulatory Visit (INDEPENDENT_AMBULATORY_CARE_PROVIDER_SITE_OTHER): Payer: Medicaid Other | Admitting: Women's Health

## 2016-05-19 ENCOUNTER — Encounter: Payer: Self-pay | Admitting: Women's Health

## 2016-05-19 VITALS — BP 120/58 | HR 88 | Wt 174.5 lb

## 2016-05-19 DIAGNOSIS — O99322 Drug use complicating pregnancy, second trimester: Secondary | ICD-10-CM | POA: Diagnosis not present

## 2016-05-19 DIAGNOSIS — Z3A20 20 weeks gestation of pregnancy: Secondary | ICD-10-CM

## 2016-05-19 DIAGNOSIS — O0992 Supervision of high risk pregnancy, unspecified, second trimester: Secondary | ICD-10-CM | POA: Diagnosis not present

## 2016-05-19 DIAGNOSIS — O358XX1 Maternal care for other (suspected) fetal abnormality and damage, fetus 1: Secondary | ICD-10-CM

## 2016-05-19 DIAGNOSIS — O2342 Unspecified infection of urinary tract in pregnancy, second trimester: Secondary | ICD-10-CM

## 2016-05-19 DIAGNOSIS — Z1389 Encounter for screening for other disorder: Secondary | ICD-10-CM

## 2016-05-19 DIAGNOSIS — O4442 Low lying placenta NOS or without hemorrhage, second trimester: Secondary | ICD-10-CM | POA: Diagnosis not present

## 2016-05-19 DIAGNOSIS — Z363 Encounter for antenatal screening for malformations: Secondary | ICD-10-CM

## 2016-05-19 DIAGNOSIS — Z331 Pregnant state, incidental: Secondary | ICD-10-CM

## 2016-05-19 DIAGNOSIS — O283 Abnormal ultrasonic finding on antenatal screening of mother: Secondary | ICD-10-CM

## 2016-05-19 DIAGNOSIS — O4440 Low lying placenta NOS or without hemorrhage, unspecified trimester: Secondary | ICD-10-CM

## 2016-05-19 DIAGNOSIS — F1290 Cannabis use, unspecified, uncomplicated: Secondary | ICD-10-CM

## 2016-05-19 DIAGNOSIS — Z3482 Encounter for supervision of other normal pregnancy, second trimester: Secondary | ICD-10-CM

## 2016-05-19 LAB — POCT URINALYSIS DIPSTICK
Glucose, UA: NEGATIVE
KETONES UA: NEGATIVE
Leukocytes, UA: NEGATIVE
NITRITE UA: NEGATIVE
Protein, UA: NEGATIVE

## 2016-05-19 NOTE — Progress Notes (Signed)
Low-risk OB appointment [id number] 5261w1d Estimated Date of Delivery: 10/05/16 BP (!) 120/58   Pulse 88   Wt 174 lb 8 oz (79.2 kg)   LMP 12/30/2015 (Approximate)   BMI 28.17 kg/m   BP, weight, and urine reviewed.  Refer to obstetrical flow sheet for FH & FHR.  Reports good fm.  Denies regular uc's, lof, vb, or uti s/s. No complaints. [facility] discussed long-term effects on fetus, advised cessation.  Reviewed today's u/s w/ isolated EICF, neg nt/it. Low-lying placenta- pelvic rest for now, will recheck at next visit. Plan:  Continue routine obstetrical care  F/U in 4wks for OB appointment and u/s to assess placental location

## 2016-05-19 NOTE — Addendum Note (Signed)
Addended by: Cheral MarkerBOOKER, KIMBERLY R on: 05/19/2016 09:50 AM   Modules accepted: Orders

## 2016-05-19 NOTE — Progress Notes (Signed)
US 20+1 wks,posterior low lying placenta, tip of placenta 9 mm from cx os,cx 4.1 cm,cephalic,normal ov's bilat,LVEICF 1.5 mm,fhr 166 bpm,EFW 310 g,anatomy complete

## 2016-05-19 NOTE — Patient Instructions (Signed)

## 2016-05-21 LAB — URINE CULTURE: ORGANISM ID, BACTERIA: NO GROWTH

## 2016-06-16 ENCOUNTER — Ambulatory Visit (INDEPENDENT_AMBULATORY_CARE_PROVIDER_SITE_OTHER): Payer: Medicaid Other | Admitting: Women's Health

## 2016-06-16 ENCOUNTER — Encounter: Payer: Self-pay | Admitting: Women's Health

## 2016-06-16 ENCOUNTER — Ambulatory Visit (INDEPENDENT_AMBULATORY_CARE_PROVIDER_SITE_OTHER): Payer: Medicaid Other

## 2016-06-16 VITALS — BP 120/70 | HR 97 | Wt 179.0 lb

## 2016-06-16 DIAGNOSIS — O4442 Low lying placenta NOS or without hemorrhage, second trimester: Secondary | ICD-10-CM

## 2016-06-16 DIAGNOSIS — Z331 Pregnant state, incidental: Secondary | ICD-10-CM

## 2016-06-16 DIAGNOSIS — Z1389 Encounter for screening for other disorder: Secondary | ICD-10-CM

## 2016-06-16 DIAGNOSIS — O283 Abnormal ultrasonic finding on antenatal screening of mother: Secondary | ICD-10-CM

## 2016-06-16 DIAGNOSIS — F17200 Nicotine dependence, unspecified, uncomplicated: Secondary | ICD-10-CM

## 2016-06-16 DIAGNOSIS — Z3482 Encounter for supervision of other normal pregnancy, second trimester: Secondary | ICD-10-CM

## 2016-06-16 DIAGNOSIS — O4440 Low lying placenta NOS or without hemorrhage, unspecified trimester: Secondary | ICD-10-CM

## 2016-06-16 LAB — POCT URINALYSIS DIPSTICK
Blood, UA: NEGATIVE
GLUCOSE UA: NEGATIVE
Ketones, UA: NEGATIVE
NITRITE UA: NEGATIVE
Protein, UA: NEGATIVE

## 2016-06-16 NOTE — Patient Instructions (Signed)
You will have your sugar test next visit.  Please do not eat or drink anything after midnight the night before you come, not even water.  You will be here for at least two hours.     Call the office ([phone]) or go to [facility] if:  You begin to have strong, frequent contractions  Your water breaks.  Sometimes it is a big gush of fluid, sometimes it is just a trickle that keeps getting your panties wet or running down your legs  You have vaginal bleeding.  It is normal to have a small amount of spotting if your cervix was checked.   You don't feel your baby moving like normal.  If you don't, get you something to eat and drink and lay down and focus on feeling your baby move.   If your baby is still not moving like normal, you should call the office or go to [facility].  Second Trimester of Pregnancy The second trimester is from week 13 through week 28, months 4 through 6. The second trimester is often a time when you feel your best. Your body has also adjusted to being pregnant, and you begin to feel better physically. Usually, morning sickness has lessened or quit completely, you may have more energy, and you may have an increase in appetite. The second trimester is also a time when the fetus is growing rapidly. At the end of the sixth month, the fetus is about 9 inches long and weighs about 1 pounds. You will likely begin to feel the baby move (quickening) between 18 and 20 weeks of the pregnancy. BODY CHANGES Your body goes through many changes during pregnancy. The changes vary from woman to woman.   Your weight will continue to increase. You will notice your lower abdomen bulging out.  You may begin to get stretch marks on your hips, abdomen, and breasts.  You may develop headaches that can be relieved by medicines approved by your health care provider.  You may urinate more often because the fetus is pressing on your bladder.  You may develop or continue to have  heartburn as a result of your pregnancy.  You may develop constipation because certain hormones are causing the muscles that push waste through your intestines to slow down.  You may develop hemorrhoids or swollen, bulging veins (varicose veins).  You may have back pain because of the weight gain and pregnancy hormones relaxing your joints between the bones in your pelvis and as a result of a shift in weight and the muscles that support your balance.  Your breasts will continue to grow and be tender.  Your gums may bleed and may be sensitive to brushing and flossing.  Dark spots or blotches (chloasma, mask of pregnancy) may develop on your face. This will likely fade after the baby is born.  A dark line from your belly button to the pubic area (linea nigra) may appear. This will likely fade after the baby is born.  You may have changes in your hair. These can include thickening of your hair, rapid growth, and changes in texture. Some women also have hair loss during or after pregnancy, or hair that feels dry or thin. Your hair will most likely return to normal after your baby is born. WHAT TO EXPECT AT YOUR PRENATAL VISITS During a routine prenatal visit:  You will be weighed to make sure you and the fetus are growing normally.  Your blood pressure will be taken.    Your abdomen will be measured to track your baby's growth.  The fetal heartbeat will be listened to.  Any test results from the previous visit will be discussed. Your health care provider may ask you:  How you are feeling.  If you are feeling the baby move.  If you have had any abnormal symptoms, such as leaking fluid, bleeding, severe headaches, or abdominal cramping.  If you have any questions. Other tests that may be performed during your second trimester include:  Blood tests that check for:  Low iron levels (anemia).  Gestational diabetes (between 24 and 28 weeks).  Rh antibodies.  Urine tests to check  for infections, diabetes, or protein in the urine.  An ultrasound to confirm the proper growth and development of the baby.  An amniocentesis to check for possible genetic problems.  Fetal screens for spina bifida and Down syndrome. HOME CARE INSTRUCTIONS   Avoid all smoking, herbs, alcohol, and unprescribed drugs. These chemicals affect the formation and growth of the baby.  Follow your health care provider's instructions regarding medicine use. There are medicines that are either safe or unsafe to take during pregnancy.  Exercise only as directed by your health care provider. Experiencing uterine cramps is a good sign to stop exercising.  Continue to eat regular, healthy meals.  Wear a good support bra for breast tenderness.  Do not use hot tubs, steam rooms, or saunas.  Wear your seat belt at all times when driving.  Avoid raw meat, uncooked cheese, cat litter boxes, and soil used by cats. These carry germs that can cause birth defects in the baby.  Take your prenatal vitamins.  Try taking a stool softener (if your health care provider approves) if you develop constipation. Eat more high-fiber foods, such as fresh vegetables or fruit and whole grains. Drink plenty of fluids to keep your urine clear or pale yellow.  Take warm sitz baths to soothe any pain or discomfort caused by hemorrhoids. Use hemorrhoid cream if your health care provider approves.  If you develop varicose veins, wear support hose. Elevate your feet for 15 minutes, 3-4 times a day. Limit salt in your diet.  Avoid heavy lifting, wear low heel shoes, and practice good posture.  Rest with your legs elevated if you have leg cramps or low back pain.  Visit your dentist if you have not gone yet during your pregnancy. Use a soft toothbrush to brush your teeth and be gentle when you floss.  A sexual relationship may be continued unless your health care provider directs you otherwise.  Continue to go to all your  prenatal visits as directed by your health care provider. SEEK MEDICAL CARE IF:   You have dizziness.  You have mild pelvic cramps, pelvic pressure, or nagging pain in the abdominal area.  You have persistent nausea, vomiting, or diarrhea.  You have a bad smelling vaginal discharge.  You have pain with urination. SEEK IMMEDIATE MEDICAL CARE IF:   You have a fever.  You are leaking fluid from your vagina.  You have spotting or bleeding from your vagina.  You have severe abdominal cramping or pain.  You have rapid weight gain or loss.  You have shortness of breath with chest pain.  You notice sudden or extreme swelling of your face, hands, ankles, feet, or legs.  You have not felt your baby move in over an hour.  You have severe headaches that do not go away with medicine.  You have vision changes.   Document Released: 02/18/2001 Document Revised: 03/01/2013 Document Reviewed: 04/27/2012 [employer] Patient Information 2015 [employer]. This information is not intended to replace advice given to you by your health care provider. Make sure you discuss any questions you have with your health care provider.     

## 2016-06-16 NOTE — Progress Notes (Signed)
[place] 24 wks,breech,post pl gr 0,low lying placenta, tip of placenta to cx 1.7 cm,cx 3.7 cm,normal ov's bilat,LEICF 2.1 mm,EFW 657 g 44%,fhr 157 bpm,afi 11.9 cm

## 2016-06-16 NOTE — Progress Notes (Signed)
Low-risk OB appointment [id number] [redacted]w[redacted]d Estimated Date of Delivery: 10/05/16 BP 120/70   Pulse 97   Wt 179 lb (81.2 kg)   LMP 12/30/2015 (Approximate)   BMI 28.89 kg/m   BP, weight, and urine reviewed.  Refer to obstetrical flow sheet for FH & FHR.  Reports good fm.  Denies regular uc's, lof, vb, or uti s/s. No complaints. Reviewed today's f/u u/s: placenta still low-lying, tip 1.7cm from os- continue pelvic rest. EICF still present, nt/it neg.  Plan:  Continue routine obstetrical care  F/U in 4wks for OB appointment, pn2, and repeat u/s to recheck placenta

## 2016-06-24 ENCOUNTER — Telehealth: Payer: Self-pay | Admitting: Women's Health

## 2016-06-24 NOTE — Telephone Encounter (Signed)
Called pt, discussed final results of u/s, per Dr. Despina Hidden, placental tip is at 1.7cm from os, still low-lying but even at this point is ok for vaginal birth, and usually continues to migrate up, so no further u/s needed. Pt verbalized understanding. F/U u/s cancelled.  Cheral Marker, CNM, [facility] 06/24/2016 5:08 PM

## 2016-07-14 ENCOUNTER — Ambulatory Visit (INDEPENDENT_AMBULATORY_CARE_PROVIDER_SITE_OTHER): Payer: Medicaid Other | Admitting: Women's Health

## 2016-07-14 ENCOUNTER — Other Ambulatory Visit: Payer: Medicaid Other

## 2016-07-14 ENCOUNTER — Encounter: Payer: Self-pay | Admitting: Women's Health

## 2016-07-14 VITALS — BP 110/60 | HR 92 | Wt 185.2 lb

## 2016-07-14 DIAGNOSIS — Z3482 Encounter for supervision of other normal pregnancy, second trimester: Secondary | ICD-10-CM

## 2016-07-14 DIAGNOSIS — Z3483 Encounter for supervision of other normal pregnancy, third trimester: Secondary | ICD-10-CM

## 2016-07-14 DIAGNOSIS — Z3A28 28 weeks gestation of pregnancy: Secondary | ICD-10-CM

## 2016-07-14 DIAGNOSIS — O4443 Low lying placenta NOS or without hemorrhage, third trimester: Secondary | ICD-10-CM

## 2016-07-14 DIAGNOSIS — Z331 Pregnant state, incidental: Secondary | ICD-10-CM

## 2016-07-14 DIAGNOSIS — O4440 Low lying placenta NOS or without hemorrhage, unspecified trimester: Secondary | ICD-10-CM

## 2016-07-14 DIAGNOSIS — Z1389 Encounter for screening for other disorder: Secondary | ICD-10-CM

## 2016-07-14 DIAGNOSIS — Z131 Encounter for screening for diabetes mellitus: Secondary | ICD-10-CM

## 2016-07-14 LAB — POCT URINALYSIS DIPSTICK
Blood, UA: NEGATIVE
Glucose, UA: NEGATIVE
KETONES UA: NEGATIVE
Leukocytes, UA: NEGATIVE
Nitrite, UA: NEGATIVE
PROTEIN UA: NEGATIVE

## 2016-07-14 NOTE — Patient Instructions (Addendum)
Call the office ([phone]) or go to [facility] if:  You begin to have strong, frequent contractions  Your water breaks.  Sometimes it is a big gush of fluid, sometimes it is just a trickle that keeps getting your panties wet or running down your legs  You have vaginal bleeding.  It is normal to have a small amount of spotting if your cervix was checked.   You don't feel your baby moving like normal.  If you don't, get you something to eat and drink and lay down and focus on feeling your baby move.  You should feel at least 10 movements in 2 hours.  If you don't, you should call the office or go to [facility].    Tdap Vaccine  It is recommended that you get the Tdap vaccine during the third trimester of EACH pregnancy to help protect your baby from getting pertussis (whooping cough)  27-36 weeks is the BEST time to do this so that you can pass the protection on to your baby. During pregnancy is better than after pregnancy, but if you are unable to get it during pregnancy it will be offered at the hospital.   You can get this vaccine at the health department or your family doctor  Everyone who will be around your baby should also be up-to-date on their vaccines. Adults (who are not pregnant) only need 1 dose of Tdap during adulthood.   Third Trimester of Pregnancy The third trimester is from week 29 through week 42, months 7 through 9. The third trimester is a time when the fetus is growing rapidly. At the end of the ninth month, the fetus is about 20 inches in length and weighs 6-10 pounds.  BODY CHANGES Your body goes through many changes during pregnancy. The changes vary from woman to woman.   Your weight will continue to increase. You can expect to gain 25-35 pounds (11-16 kg) by the end of the pregnancy.  You may begin to get stretch marks on your hips, abdomen, and breasts.  You may urinate more often because the fetus is moving lower into your pelvis and pressing on  your bladder.  You may develop or continue to have heartburn as a result of your pregnancy.  You may develop constipation because certain hormones are causing the muscles that push waste through your intestines to slow down.  You may develop hemorrhoids or swollen, bulging veins (varicose veins).  You may have pelvic pain because of the weight gain and pregnancy hormones relaxing your joints between the bones in your pelvis. Backaches may result from overexertion of the muscles supporting your posture.  You may have changes in your hair. These can include thickening of your hair, rapid growth, and changes in texture. Some women also have hair loss during or after pregnancy, or hair that feels dry or thin. Your hair will most likely return to normal after your baby is born.  Your breasts will continue to grow and be tender. A yellow discharge may leak from your breasts called colostrum.  Your belly button may stick out.  You may feel short of breath because of your expanding uterus.  You may notice the fetus "dropping," or moving lower in your abdomen.  You may have a bloody mucus discharge. This usually occurs a few days to a week before labor begins.  Your cervix becomes thin and soft (effaced) near your due date. WHAT TO EXPECT AT YOUR PRENATAL EXAMS  You will have prenatal   exams every 2 weeks until week 36. Then, you will have weekly prenatal exams. During a routine prenatal visit:  You will be weighed to make sure you and the fetus are growing normally.  Your blood pressure is taken.  Your abdomen will be measured to track your baby's growth.  The fetal heartbeat will be listened to.  Any test results from the previous visit will be discussed.  You may have a cervical check near your due date to see if you have effaced. At around 36 weeks, your caregiver will check your cervix. At the same time, your caregiver will also perform a test on the secretions of the vaginal tissue.  This test is to determine if a type of bacteria, Group B streptococcus, is present. Your caregiver will explain this further. Your caregiver may ask you:  What your birth plan is.  How you are feeling.  If you are feeling the baby move.  If you have had any abnormal symptoms, such as leaking fluid, bleeding, severe headaches, or abdominal cramping.  If you have any questions. Other tests or screenings that may be performed during your third trimester include:  Blood tests that check for low iron levels (anemia).  Fetal testing to check the health, activity level, and growth of the fetus. Testing is done if you have certain medical conditions or if there are problems during the pregnancy. FALSE LABOR You may feel small, irregular contractions that eventually go away. These are called Braxton Hicks contractions, or false labor. Contractions may last for hours, days, or even weeks before true labor sets in. If contractions come at regular intervals, intensify, or become painful, it is best to be seen by your caregiver.  SIGNS OF LABOR   Menstrual-like cramps.  Contractions that are 5 minutes apart or less.  Contractions that start on the top of the uterus and spread down to the lower abdomen and back.  A sense of increased pelvic pressure or back pain.  A watery or bloody mucus discharge that comes from the vagina. If you have any of these signs before the 37th week of pregnancy, call your caregiver right away. You need to go to the hospital to get checked immediately. HOME CARE INSTRUCTIONS   Avoid all smoking, herbs, alcohol, and unprescribed drugs. These chemicals affect the formation and growth of the baby.  Follow your caregiver's instructions regarding medicine use. There are medicines that are either safe or unsafe to take during pregnancy.  Exercise only as directed by your caregiver. Experiencing uterine cramps is a good sign to stop exercising.  Continue to eat regular,  healthy meals.  Wear a good support bra for breast tenderness.  Do not use hot tubs, steam rooms, or saunas.  Wear your seat belt at all times when driving.  Avoid raw meat, uncooked cheese, cat litter boxes, and soil used by cats. These carry germs that can cause birth defects in the baby.  Take your prenatal vitamins.  Try taking a stool softener (if your caregiver approves) if you develop constipation. Eat more high-fiber foods, such as fresh vegetables or fruit and whole grains. Drink plenty of fluids to keep your urine clear or pale yellow.  Take warm sitz baths to soothe any pain or discomfort caused by hemorrhoids. Use hemorrhoid cream if your caregiver approves.  If you develop varicose veins, wear support hose. Elevate your feet for 15 minutes, 3-4 times a day. Limit salt in your diet.  Avoid heavy lifting, wear low heal  shoes, and practice good posture.  Rest a lot with your legs elevated if you have leg cramps or low back pain.  Visit your dentist if you have not gone during your pregnancy. Use a soft toothbrush to brush your teeth and be gentle when you floss.  A sexual relationship may be continued unless your caregiver directs you otherwise.  Do not travel far distances unless it is absolutely necessary and only with the approval of your caregiver.  Take prenatal classes to understand, practice, and ask questions about the labor and delivery.  Make a trial run to the hospital.  Pack your hospital bag.  Prepare the baby's nursery.  Continue to go to all your prenatal visits as directed by your caregiver. SEEK MEDICAL CARE IF:  You are unsure if you are in labor or if your water has broken.  You have dizziness.  You have mild pelvic cramps, pelvic pressure, or nagging pain in your abdominal area.  You have persistent nausea, vomiting, or diarrhea.  You have a bad smelling vaginal discharge.  You have pain with urination. SEEK IMMEDIATE MEDICAL CARE IF:    You have a fever.  You are leaking fluid from your vagina.  You have spotting or bleeding from your vagina.  You have severe abdominal cramping or pain.  You have rapid weight loss or gain.  You have shortness of breath with chest pain.  You notice sudden or extreme swelling of your face, hands, ankles, feet, or legs.  You have not felt your baby move in over an hour.  You have severe headaches that do not go away with medicine.  You have vision changes. Document Released: 02/18/2001 Document Revised: 03/01/2013 Document Reviewed: 04/27/2012 ExitCare Patient Information 2015 [employer]. This information is not intended to replace advice given to you by your health care provider. Make sure you discuss any questions you have with your health care provider.  Thinking About Waterbirth???  Why consider waterbirth? . Gentle birth for babies . Less pain medicine used in labor . May allow for passive descent/less pushing . May reduce perineal tears  . More mobility and instinctive maternal position changes . Increased maternal relaxation . Reduced blood pressure in labor  Is waterbirth safe? What are the risks of infection, drowning or other complications? . Infection o Very low risk (3.7 % for tub vs 4.8% for bed) o 7 in 8000 waterbirths with documented infection o Poorly cleaned equipment most common cause o Slightly lower group B strep transmission rate  . Drowning o Maternal:  - Very low risk   - Related to seizures or fainting o Newborn:  - Very low risk. No evidence of increased risk of respiratory problems in multiple large studies - Physiological protection from breathing under water - Avoid underwater birth if there are any fetal complications - Once baby's head is out of the water, keep it out.  . Birth complication o Some reports of cord trauma, but risk decreased by bringing baby to surface gradually o No evidence of increased risk of shoulder  dystocia. Mothers can usually change positions faster in water than in a bed, possibly aiding the maneuvers to free the shoulder.  You must attend a Waterbirth class at [facility]  3rd Wednesday of every month from 7-9pm  Free  Register by calling [phone] or online at [email or web address]  Bring us the certificate from the class  Waterbirth supplies needed for [facility] patients:  Our practice has a Birth Pool in a   Box tub (Regular size) at the hospital that you can borrow  You will need to purchase an accessory kit that has all needed supplies through [place] [phone] for kit, $65 for liner=$179+tax) or online through [employer]  Or you can purchase the supplies separately: o Single-use disposable tub liner for Birth Pool in a Box (REGULAR size) o New garden hose labeled "lead-free", "suitable for drinking water", "non-toxic" OR "water potable" o Garden hose to remove the dirty water o Electric drain pump to remove water (We recommend 792 gallon per hour or greater pump.)  o Fish net o Bathing suit top (optional) o Long-handled mirror (optional)  [employer]- sells EVERYTHING waterbirth related, accessory kits, tubs, Psychologist, forensic ([facility]) this is a great service if you don't want to be responsible for the set up/take down of tub! Just call the [facility] and they will come do it for you for $200! This includes the rental fee for their tub, the accessory kit, set-up and take down   Things that would prevent you from having a waterbirth:  Premature, <37wks  Previous cesarean birth  Presence of thick meconium-stained fluid  Multiple gestation (Twins, triplets, etc.)  Uncontrolled diabetes  Hypertension  Heavy vaginal bleeding  Non-reassuring fetal heart rate  Active infection (MRSA, etc.)  If your labor has to be induced  Other risk issues identified by your obstetrical provider

## 2016-07-14 NOTE — Progress Notes (Signed)
Low-risk OB appointment [id number] Estimated Date of Delivery: 10/05/16 BP 110/60   Pulse 92   Wt 185 lb 3.2 oz (84 kg)   LMP 12/30/2015 (Approximate)   BMI 29.89 kg/m   BP, weight, and urine reviewed.  Refer to obstetrical flow sheet for FH & FHR.  Reports good fm.  Denies regular uc's, lof, vb, or uti s/s. Burning sensation upper abdomen- likely diastasis recti- discussed relief measures. Interested in waterbirth- discussed and gave printed info, to sign up for class. Undecided about BTL, discussed again today, to let us know at next visit.  Reviewed ptl s/s, fkc. Recommended Tdap at HD/PCP per CDC guidelines.  Plan:  Continue routine obstetrical care  F/U in 4wks for OB appointment and rhogam PN2 today

## 2016-07-15 LAB — CBC
HEMATOCRIT: 35.1 % (ref 34.0–46.6)
Hemoglobin: 11.9 g/dL (ref 11.1–15.9)
MCH: 30.3 pg (ref 26.6–33.0)
MCHC: 33.9 g/dL (ref 31.5–35.7)
MCV: 89 fL (ref 79–97)
Platelets: 160 10*3/uL (ref 150–379)
RBC: 3.93 x10E6/uL (ref 3.77–5.28)
RDW: 13.4 % (ref 12.3–15.4)
WBC: 10.7 10*3/uL (ref 3.4–10.8)

## 2016-07-15 LAB — GLUCOSE TOLERANCE, 2 HOURS W/ 1HR
GLUCOSE, 1 HOUR: 106 mg/dL (ref 65–179)
GLUCOSE, 2 HOUR: 98 mg/dL (ref 65–152)
Glucose, Fasting: 74 mg/dL (ref 65–91)

## 2016-07-15 LAB — ANTIBODY SCREEN: ANTIBODY SCREEN: NEGATIVE

## 2016-07-15 LAB — HIV ANTIBODY (ROUTINE TESTING W REFLEX): HIV Screen 4th Generation wRfx: NONREACTIVE

## 2016-07-15 LAB — RPR: RPR Ser Ql: NONREACTIVE

## 2016-07-16 ENCOUNTER — Encounter: Payer: Self-pay | Admitting: Women's Health

## 2016-08-18 ENCOUNTER — Ambulatory Visit (INDEPENDENT_AMBULATORY_CARE_PROVIDER_SITE_OTHER): Payer: Medicaid Other | Admitting: Women's Health

## 2016-08-18 ENCOUNTER — Encounter: Payer: Self-pay | Admitting: Women's Health

## 2016-08-18 VITALS — BP 110/60 | HR 84 | Wt 191.2 lb

## 2016-08-18 DIAGNOSIS — Z3483 Encounter for supervision of other normal pregnancy, third trimester: Secondary | ICD-10-CM

## 2016-08-18 DIAGNOSIS — Z331 Pregnant state, incidental: Secondary | ICD-10-CM

## 2016-08-18 DIAGNOSIS — O26899 Other specified pregnancy related conditions, unspecified trimester: Secondary | ICD-10-CM

## 2016-08-18 DIAGNOSIS — Z6791 Unspecified blood type, Rh negative: Secondary | ICD-10-CM

## 2016-08-18 DIAGNOSIS — F1290 Cannabis use, unspecified, uncomplicated: Secondary | ICD-10-CM

## 2016-08-18 DIAGNOSIS — O09893 Supervision of other high risk pregnancies, third trimester: Secondary | ICD-10-CM

## 2016-08-18 DIAGNOSIS — O26893 Other specified pregnancy related conditions, third trimester: Secondary | ICD-10-CM

## 2016-08-18 DIAGNOSIS — Z1389 Encounter for screening for other disorder: Secondary | ICD-10-CM

## 2016-08-18 DIAGNOSIS — O09899 Supervision of other high risk pregnancies, unspecified trimester: Secondary | ICD-10-CM

## 2016-08-18 MED ORDER — RHO D IMMUNE GLOBULIN 1500 UNITS IM SOSY
1500.0000 [IU] | PREFILLED_SYRINGE | Freq: Once | INTRAMUSCULAR | Status: DC
Start: 2016-08-18 — End: 2016-08-18

## 2016-08-18 MED ORDER — RHO D IMMUNE GLOBULIN 1500 UNIT/2ML IJ SOSY
300.0000 ug | PREFILLED_SYRINGE | Freq: Once | INTRAMUSCULAR | Status: AC
  Administered 2016-08-18: 300 ug via INTRAMUSCULAR

## 2016-08-18 NOTE — Patient Instructions (Signed)
Call the office ([phone]) or go to [facility] if:  You begin to have strong, frequent contractions  Your water breaks.  Sometimes it is a big gush of fluid, sometimes it is just a trickle that keeps getting your panties wet or running down your legs  You have vaginal bleeding.  It is normal to have a small amount of spotting if your cervix was checked.   You don't feel your baby moving like normal.  If you don't, get you something to eat and drink and lay down and focus on feeling your baby move.  You should feel at least 10 movements in 2 hours.  If you don't, you should call the office or go to [facility].     Preterm Labor and Birth Information The normal length of a pregnancy is 39-41 weeks. Preterm labor is when labor starts before 37 completed weeks of pregnancy. What are the risk factors for preterm labor? Preterm labor is more likely to occur in women who:  Have certain infections during pregnancy such as a bladder infection, sexually transmitted infection, or infection inside the uterus (chorioamnionitis).  Have a shorter-than-normal cervix.  Have gone into preterm labor before.  Have had surgery on their cervix.  Are younger than age 17 or older than age 35.  Are African American.  Are pregnant with twins or multiple babies (multiple gestation).  Take street drugs or smoke while pregnant.  Do not gain enough weight while pregnant.  Became pregnant shortly after having been pregnant.  What are the symptoms of preterm labor? Symptoms of preterm labor include:  Cramps similar to those that can happen during a menstrual period. The cramps may happen with diarrhea.  Pain in the abdomen or lower back.  Regular uterine contractions that may feel like tightening of the abdomen.  A feeling of increased pressure in the pelvis.  Increased watery or bloody mucus discharge from the vagina.  Water breaking (ruptured amniotic sac).  Why is it important to  recognize signs of preterm labor? It is important to recognize signs of preterm labor because babies who are born prematurely may not be fully developed. This can put them at an increased risk for:  Long-term (chronic) heart and lung problems.  Difficulty immediately after birth with regulating body systems, including blood sugar, body temperature, heart rate, and breathing rate.  Bleeding in the brain.  Cerebral palsy.  Learning difficulties.  Death.  These risks are highest for babies who are born before 34 weeks of pregnancy. How is preterm labor treated? Treatment depends on the length of your pregnancy, your condition, and the health of your baby. It may involve:  Having a stitch (suture) placed in your cervix to prevent your cervix from opening too early (cerclage).  Taking or being given medicines, such as: ? Hormone medicines. These may be given early in pregnancy to help support the pregnancy. ? Medicine to stop contractions. ? Medicines to help mature the baby's lungs. These may be prescribed if the risk of delivery is high. ? Medicines to prevent your baby from developing cerebral palsy.  If the labor happens before 34 weeks of pregnancy, you may need to stay in the hospital. What should I do if I think I am in preterm labor? If you think that you are going into preterm labor, call your health care provider right away. How can I prevent preterm labor in future pregnancies? To increase your chance of having a full-term pregnancy:  Do not use   any tobacco products, such as cigarettes, chewing tobacco, and e-cigarettes. If you need help quitting, ask your health care provider.  Do not use street drugs or medicines that have not been prescribed to you during your pregnancy.  Talk with your health care provider before taking any herbal supplements, even if you have been taking them regularly.  Make sure you gain a healthy amount of weight during your pregnancy.  Watch  for infection. If you think that you might have an infection, get it checked right away.  Make sure to tell your health care provider if you have gone into preterm labor before.  This information is not intended to replace advice given to you by your health care provider. Make sure you discuss any questions you have with your health care provider. Document Released: 05/17/2003 Document Revised: 08/07/2015 Document Reviewed: 07/18/2015 Elsevier Interactive Patient Education  2018 [employer]  

## 2016-08-18 NOTE — Progress Notes (Signed)
Low-risk OB appointment [id number] Estimated Date of Delivery: 10/05/16 BP 110/60   Pulse 84   Wt 191 lb 3.2 oz (86.7 kg)   LMP 12/30/2015 (Approximate)   BMI 30.86 kg/m   BP, weight, and urine reviewed.  Refer to obstetrical flow sheet for FH & FHR.  Reports good fm.  Denies regular uc's, lof, vb, or uti s/s. No complaints. Has decided against BTL. Discussed all contraception options, undecided at this time. Still interested in waterbirth, has class this Wed, plans to use [facility], to call them after class, and make sure 36wk appt is w/ me or Drenda FreezeFran.  Reviewed ptl s/s, pn2 results, fkc. Recommended Tdap at HD/PCP per CDC guidelines.  Plan:  Continue routine obstetrical care  F/U in 2wks for OB appointment

## 2016-08-18 NOTE — Addendum Note (Signed)
Addended by: Federico FlakeNES, PEGGY A on: 08/18/2016 10:06 AM   Modules accepted: Orders

## 2016-08-19 LAB — PMP SCREEN PROFILE (10S), URINE
AMPHETAMINE SCREEN URINE: NEGATIVE ng/mL
BARBITURATE SCREEN URINE: NEGATIVE ng/mL
BENZODIAZEPINE SCREEN, URINE: NEGATIVE ng/mL
CANNABINOIDS UR QL SCN: POSITIVE ng/mL
CREATININE(CRT), U: 102.4 mg/dL (ref 20.0–300.0)
Cocaine (Metab) Scrn, Ur: NEGATIVE ng/mL
Methadone Screen, Urine: NEGATIVE ng/mL
OXYCODONE+OXYMORPHONE UR QL SCN: NEGATIVE ng/mL
Opiate Scrn, Ur: NEGATIVE ng/mL
PH UR, DRUG SCRN: 6 (ref 4.5–8.9)
PHENCYCLIDINE QUANTITATIVE URINE: NEGATIVE ng/mL
Propoxyphene Scrn, Ur: NEGATIVE ng/mL

## 2016-08-19 LAB — MED LIST OPTION NOT SELECTED

## 2016-08-28 ENCOUNTER — Inpatient Hospital Stay (HOSPITAL_COMMUNITY)
Admission: AD | Admit: 2016-08-28 | Discharge: 2016-08-29 | Disposition: A | Discharge status: Home / Self Care | Payer: Medicaid Other | Source: Ambulatory Visit | Attending: Obstetrics & Gynecology | Admitting: Obstetrics & Gynecology

## 2016-08-28 ENCOUNTER — Encounter (HOSPITAL_COMMUNITY): Payer: Self-pay

## 2016-08-28 DIAGNOSIS — Z811 Family history of alcohol abuse and dependence: Secondary | ICD-10-CM | POA: Insufficient documentation

## 2016-08-28 DIAGNOSIS — Z820 Family history of epilepsy and other diseases of the nervous system: Secondary | ICD-10-CM | POA: Diagnosis not present

## 2016-08-28 DIAGNOSIS — O99333 Smoking (tobacco) complicating pregnancy, third trimester: Secondary | ICD-10-CM | POA: Diagnosis not present

## 2016-08-28 DIAGNOSIS — Z8249 Family history of ischemic heart disease and other diseases of the circulatory system: Secondary | ICD-10-CM | POA: Diagnosis not present

## 2016-08-28 DIAGNOSIS — Z809 Family history of malignant neoplasm, unspecified: Secondary | ICD-10-CM | POA: Insufficient documentation

## 2016-08-28 DIAGNOSIS — O4693 Antepartum hemorrhage, unspecified, third trimester: Secondary | ICD-10-CM | POA: Diagnosis not present

## 2016-08-28 DIAGNOSIS — Z79899 Other long term (current) drug therapy: Secondary | ICD-10-CM | POA: Diagnosis not present

## 2016-08-28 DIAGNOSIS — Z3A35 35 weeks gestation of pregnancy: Secondary | ICD-10-CM | POA: Diagnosis not present

## 2016-08-28 DIAGNOSIS — Z3A34 34 weeks gestation of pregnancy: Secondary | ICD-10-CM | POA: Diagnosis not present

## 2016-08-28 DIAGNOSIS — Z833 Family history of diabetes mellitus: Secondary | ICD-10-CM | POA: Insufficient documentation

## 2016-08-28 DIAGNOSIS — F17210 Nicotine dependence, cigarettes, uncomplicated: Secondary | ICD-10-CM | POA: Insufficient documentation

## 2016-08-28 DIAGNOSIS — O4703 False labor before 37 completed weeks of gestation, third trimester: Secondary | ICD-10-CM | POA: Diagnosis not present

## 2016-08-28 NOTE — MAU Note (Signed)
Pt reports some vaginal spotting and lower abdominal cramping that started around 8p. Pt states she did have a low lying placenta earlier in the pregnancy that has since resolved. Pt reports good fetal movement. Pt denies urinary s/s, vaginal itching, odor, etc.

## 2016-08-29 ENCOUNTER — Inpatient Hospital Stay (HOSPITAL_COMMUNITY): Payer: Medicaid Other

## 2016-08-29 DIAGNOSIS — O4703 False labor before 37 completed weeks of gestation, third trimester: Secondary | ICD-10-CM | POA: Diagnosis not present

## 2016-08-29 DIAGNOSIS — Z3A35 35 weeks gestation of pregnancy: Secondary | ICD-10-CM | POA: Diagnosis not present

## 2016-08-29 LAB — GC/CHLAMYDIA PROBE AMP (~~LOC~~) NOT AT ARMC
Chlamydia: NEGATIVE
Neisseria Gonorrhea: NEGATIVE

## 2016-08-29 LAB — WET PREP, GENITAL
Clue Cells Wet Prep HPF POC: NONE SEEN
SPERM: NONE SEEN
TRICH WET PREP: NONE SEEN
YEAST WET PREP: NONE SEEN

## 2016-08-29 LAB — URINALYSIS, MICROSCOPIC (REFLEX)

## 2016-08-29 LAB — URINALYSIS, ROUTINE W REFLEX MICROSCOPIC
Bilirubin Urine: NEGATIVE
GLUCOSE, UA: NEGATIVE mg/dL
KETONES UR: NEGATIVE mg/dL
Nitrite: NEGATIVE
PROTEIN: NEGATIVE mg/dL
Specific Gravity, Urine: <1.005 — ABNORMAL LOW (ref 1.005–1.030)
pH: 6 (ref 5.0–8.0)

## 2016-08-29 LAB — CBC
HCT: 33.8 % — ABNORMAL LOW (ref 36.0–46.0)
Hemoglobin: 11.7 g/dL — ABNORMAL LOW (ref 12.0–15.0)
MCH: 31 pg (ref 26.0–34.0)
MCHC: 34.6 g/dL (ref 30.0–36.0)
MCV: 89.7 fL (ref 78.0–100.0)
Platelets: 130 10*3/uL — ABNORMAL LOW (ref 150–400)
RBC: 3.77 MIL/uL — ABNORMAL LOW (ref 3.87–5.11)
RDW: 13.5 % (ref 11.5–15.5)
WBC: 13.3 10*3/uL — ABNORMAL HIGH (ref 4.0–10.5)

## 2016-08-29 NOTE — MAU Provider Note (Signed)
Chief Complaint:  Vaginal Bleeding   First Provider Initiated Contact with Patient 08/29/16 0018      HPI: Alyssa Massey is a 31 y.o. G3P2002 at 6634w5dwho presents to maternity admissions reporting onset of spotting and cramping today. The bleeding started as bright red light bleeding when wiping but then became dark red and brown noted in her underwear and on a pad.  The pain is low in her abdomen, cramping pain that is intermittent every few minutes.  Nothing makes her pain or bleeding better or worse.  She has not tried any treatments.  It is the same since onset. There are no other associated symptoms. She does report being outside at games x 3 days in a row with her son who is playing [employer] baseball.   She reports good fetal movement, denies LOF, vaginal itching/burning, urinary symptoms, h/a, dizziness, n/v, or fever/chills.    HPI  Past Medical History: Past Medical History:  Diagnosis Date  . Medical history non-contributory     Past obstetric history: OB History  Gravida Para Term Preterm AB Living  3 2 2  0 0 2  SAB TAB Ectopic Multiple Live Births  0 0 0 0 2    # Outcome Date GA Lbr Len/2nd Weight Sex Delivery Anes PTL Lv  3 Current           2 Term 09/10/10 7437w4d  8 lb 1 oz (3.657 kg) F Vag-Spont EPI N LIV  1 Term 12/03/07 3460w2d  8 lb 3 oz (3.714 kg) M Vag-Spont EPI N LIV      Past Surgical History: Past Surgical History:  Procedure Laterality Date  . NO PAST SURGERIES      Family History: Family History  Problem Relation Age of Onset  . Other Mother        3 blockages in heart  . Alcohol abuse Father   . Cancer Paternal Grandfather   . Diabetes Paternal Grandmother   . Hypertension Paternal Grandmother   . Other Maternal Grandmother        brain tumor  . Alzheimer's disease Maternal Grandfather     Social History: Social History  Substance Use Topics  . Smoking status: Current Every Day Smoker    Packs/day: 0.50    Years: 14.00    Types:  Cigarettes  . Smokeless tobacco: Never Used  . Alcohol use No    Allergies: No Known Allergies  Meds:  Prescriptions Prior to Admission  Medication Sig Dispense Refill Last Dose  . flintstones complete (FLINTSTONES) 60 MG chewable tablet Chew 1 tablet by mouth daily.   08/27/2016 at Unknown time    ROS:  Review of Systems  Constitutional: Negative for chills, fatigue and fever.  Eyes: Negative for visual disturbance.  Respiratory: Negative for shortness of breath.   Cardiovascular: Negative for chest pain.  Gastrointestinal: Positive for abdominal pain. Negative for nausea and vomiting.  Genitourinary: Positive for pelvic pain and vaginal bleeding. Negative for difficulty urinating, dysuria, flank pain, vaginal discharge and vaginal pain.  Neurological: Negative for dizziness and headaches.  Psychiatric/Behavioral: Negative.      I have reviewed patient's Past Medical Hx, Surgical Hx, Family Hx, Social Hx, medications and allergies.   Physical Exam   Patient Vitals for the past 24 hrs:  BP Temp Temp src Pulse Resp SpO2 Height Weight  08/28/16 2345 124/73 98.3 F (36.8 C) Oral 96 16 99 % - -  08/28/16 2339 - - - - - - 5\' 6"  (1.676  m) 192 lb (87.1 kg)   Constitutional: Well-developed, well-nourished female in no acute distress.  Cardiovascular: normal rate Respiratory: normal effort GI: Abd soft, non-tender, gravid appropriate for gestational age.  MS: Extremities nontender, no edema, normal ROM Neurologic: Alert and oriented x 4.  GU: Neg CVAT.  Gentle speculum exam: Cervix pink, visually 1-2 cm, without lesion, moderate amount dark red bleeding with small clots, vaginal walls and external genitalia normal   After [place], SVE performed: Dilation: 1.5 Effacement (%): Thick Cervical Position: Posterior Exam by:: Sharen Counter CNM  Brown, dark blood noted on glove Dilation: 1.5 Effacement (%): Thick Cervical Position: Posterior Exam by:: Sharen Counter  CNM  FHT:  Baseline 135 , moderate variability, accelerations present, no decelerations Contractions: initially q 1-2 mins, mild to palpation, then irregular and less frequent during MAU stay   Labs: Results for orders placed or performed during the hospital encounter of 08/28/16 (from the past 24 hour(s))  Urinalysis, Routine w reflex microscopic     Status: Abnormal   Collection Time: 08/28/16 11:34 PM  Result Value Ref Range   Color, Urine YELLOW YELLOW   APPearance HAZY (A) CLEAR   Specific Gravity, Urine <1.005 (L) 1.005 - 1.030   pH 6.0 5.0 - 8.0   Glucose, UA NEGATIVE NEGATIVE mg/dL   Hgb urine dipstick LARGE (A) NEGATIVE   Bilirubin Urine NEGATIVE NEGATIVE   Ketones, ur NEGATIVE NEGATIVE mg/dL   Protein, ur NEGATIVE NEGATIVE mg/dL   Nitrite NEGATIVE NEGATIVE   Leukocytes, UA SMALL (A) NEGATIVE  Urinalysis, Microscopic (reflex)     Status: Abnormal   Collection Time: 08/28/16 11:34 PM  Result Value Ref Range   RBC / HPF 0-5 0 - 5 RBC/hpf   WBC, UA 6-30 0 - 5 WBC/hpf   Bacteria, UA FEW (A) NONE SEEN   Squamous Epithelial / LPF 0-5 (A) NONE SEEN  Wet prep, genital     Status: Abnormal   Collection Time: 08/29/16 12:25 AM  Result Value Ref Range   Yeast Wet Prep HPF POC NONE SEEN NONE SEEN   Trich, Wet Prep NONE SEEN NONE SEEN   Clue Cells Wet Prep HPF POC NONE SEEN NONE SEEN   WBC, Wet Prep HPF POC FEW (A) NONE SEEN   Sperm NONE SEEN   CBC     Status: Abnormal   Collection Time: 08/29/16 12:32 AM  Result Value Ref Range   WBC 13.3 (H) 4.0 - 10.5 K/uL   RBC 3.77 (L) 3.87 - 5.11 MIL/uL   Hemoglobin 11.7 (L) 12.0 - 15.0 g/dL   HCT 16.1 (L) 09.6 - 04.5 %   MCV 89.7 78.0 - 100.0 fL   MCH 31.0 26.0 - 34.0 pg   MCHC 34.6 30.0 - 36.0 g/dL   RDW 40.9 81.1 - 91.4 %   Platelets 130 (L) 150 - 400 K/uL  Type and screen     Status: None (Preliminary result)   Collection Time: 08/29/16 12:32 AM  Result Value Ref Range   ABO/RH(D) A NEG    Antibody Screen POS    Sample  Expiration 09/01/2016    Antibody Identification PENDING    DAT, IgG NEG    --/--/A NEG (06/22 0032)  Imaging:  No results found.  MAU Course/MDM: I have ordered labs and reviewed results.  Antibody screen positive but pt received rhogam on 6/11 according to prenatal record NST reviewed and reactive Limited OB US with normal placenta, 2 cm from os Consult Dr Erin Fulling with presentation, exam  findings and test results.  Since bleeding is old/dark with no evidence of active bleed, and low lying placenta completely resolved, most likely explanation for bleeding is recent cervical change to 1 cm.  Pt may have overdone things with recent baseball games with her son.   Recommend rest, drink plenty of fluids, follow up at [facility] on Monday as scheduled, return to MAU as needed for emergencies Bleeding precautions reviewed Pt stable at time of discharge.   Assessment: 1. Threatened preterm labor, third trimester   2. Vaginal bleeding in pregnancy, third trimester     Plan: Discharge home with bleeding and preterm labor precautions Fetal kick counts reviewed  Follow-up Information    [facility] Follow up.   Why:  On Monday as scheduled, return to MAU as needed for emergencies Contact information: [address] [place] [address] [phone]         Allergies as of 08/29/2016   No Known Allergies     Medication List    TAKE these medications   flintstones complete 60 MG chewable tablet Chew 1 tablet by mouth daily.       Sharen Counter Certified Nurse-Midwife 08/29/2016 2:24 AM

## 2016-08-30 LAB — TYPE AND SCREEN
ABO/RH(D): A NEG
Antibody Screen: POSITIVE
DAT, IgG: NEGATIVE
Unit division: 0
Unit division: 0

## 2016-08-30 LAB — BPAM RBC
BLOOD PRODUCT EXPIRATION DATE: 201807102359
BLOOD PRODUCT EXPIRATION DATE: 201807112359
UNIT TYPE AND RH: 9500
UNIT TYPE AND RH: 9500

## 2016-09-01 ENCOUNTER — Ambulatory Visit (INDEPENDENT_AMBULATORY_CARE_PROVIDER_SITE_OTHER): Payer: Medicaid Other | Admitting: Women's Health

## 2016-09-01 ENCOUNTER — Encounter: Payer: Self-pay | Admitting: Women's Health

## 2016-09-01 VITALS — BP 128/78 | HR 96 | Wt 192.0 lb

## 2016-09-01 DIAGNOSIS — Z1389 Encounter for screening for other disorder: Secondary | ICD-10-CM

## 2016-09-01 DIAGNOSIS — Z3483 Encounter for supervision of other normal pregnancy, third trimester: Secondary | ICD-10-CM

## 2016-09-01 DIAGNOSIS — Z331 Pregnant state, incidental: Secondary | ICD-10-CM

## 2016-09-01 LAB — POCT URINALYSIS DIPSTICK
Glucose, UA: NEGATIVE
KETONES UA: NEGATIVE
Nitrite, UA: NEGATIVE
RBC UA: NEGATIVE

## 2016-09-01 NOTE — Patient Instructions (Signed)
Call the office ([phone]) or go to [facility] if:  You begin to have strong, frequent contractions  Your water breaks.  Sometimes it is a big gush of fluid, sometimes it is just a trickle that keeps getting your panties wet or running down your legs  You have vaginal bleeding.  It is normal to have a small amount of spotting if your cervix was checked.   You don't feel your baby moving like normal.  If you don't, get you something to eat and drink and lay down and focus on feeling your baby move.  You should feel at least 10 movements in 2 hours.  If you don't, you should call the office or go to [facility].     Preterm Labor and Birth Information The normal length of a pregnancy is 39-41 weeks. Preterm labor is when labor starts before 37 completed weeks of pregnancy. What are the risk factors for preterm labor? Preterm labor is more likely to occur in women who:  Have certain infections during pregnancy such as a bladder infection, sexually transmitted infection, or infection inside the uterus (chorioamnionitis).  Have a shorter-than-normal cervix.  Have gone into preterm labor before.  Have had surgery on their cervix.  Are younger than age 17 or older than age 35.  Are African American.  Are pregnant with twins or multiple babies (multiple gestation).  Take street drugs or smoke while pregnant.  Do not gain enough weight while pregnant.  Became pregnant shortly after having been pregnant.  What are the symptoms of preterm labor? Symptoms of preterm labor include:  Cramps similar to those that can happen during a menstrual period. The cramps may happen with diarrhea.  Pain in the abdomen or lower back.  Regular uterine contractions that may feel like tightening of the abdomen.  A feeling of increased pressure in the pelvis.  Increased watery or bloody mucus discharge from the vagina.  Water breaking (ruptured amniotic sac).  Why is it important to  recognize signs of preterm labor? It is important to recognize signs of preterm labor because babies who are born prematurely may not be fully developed. This can put them at an increased risk for:  Long-term (chronic) heart and lung problems.  Difficulty immediately after birth with regulating body systems, including blood sugar, body temperature, heart rate, and breathing rate.  Bleeding in the brain.  Cerebral palsy.  Learning difficulties.  Death.  These risks are highest for babies who are born before 34 weeks of pregnancy. How is preterm labor treated? Treatment depends on the length of your pregnancy, your condition, and the health of your baby. It may involve:  Having a stitch (suture) placed in your cervix to prevent your cervix from opening too early (cerclage).  Taking or being given medicines, such as: ? Hormone medicines. These may be given early in pregnancy to help support the pregnancy. ? Medicine to stop contractions. ? Medicines to help mature the baby's lungs. These may be prescribed if the risk of delivery is high. ? Medicines to prevent your baby from developing cerebral palsy.  If the labor happens before 34 weeks of pregnancy, you may need to stay in the hospital. What should I do if I think I am in preterm labor? If you think that you are going into preterm labor, call your health care provider right away. How can I prevent preterm labor in future pregnancies? To increase your chance of having a full-term pregnancy:  Do not use   any tobacco products, such as cigarettes, chewing tobacco, and e-cigarettes. If you need help quitting, ask your health care provider.  Do not use street drugs or medicines that have not been prescribed to you during your pregnancy.  Talk with your health care provider before taking any herbal supplements, even if you have been taking them regularly.  Make sure you gain a healthy amount of weight during your pregnancy.  Watch  for infection. If you think that you might have an infection, get it checked right away.  Make sure to tell your health care provider if you have gone into preterm labor before.  This information is not intended to replace advice given to you by your health care provider. Make sure you discuss any questions you have with your health care provider. Document Released: 05/17/2003 Document Revised: 08/07/2015 Document Reviewed: 07/18/2015 Elsevier Interactive Patient Education  2018 [employer]  

## 2016-09-01 NOTE — Progress Notes (Signed)
Low-risk OB appointment [id number] 5854w1d Estimated Date of Delivery: 10/05/16 BP 128/78   Pulse 96   Wt 192 lb (87.1 kg)   LMP 12/30/2015 (Approximate)   BMI 30.99 kg/m   BP, weight, and urine reviewed.  Refer to obstetrical flow sheet for FH & FHR.  Reports good fm.  Denies regular uc's, lof, vb, or uti s/s. No complaints. [facility] for vag bleeding not r/t sex, cx was 1-2/th, limited u/s- placenta 2cm from os, no evidence of abruption. Brownish, now none.   Still interested in waterbirth, went to class 6/13, brought certificate. Plans to use [facility] for tub, plans to call them today. Will sign consents next week.  Reviewed ptl s/s, fkc. Plan:  Continue routine obstetrical care  F/U in 1wk for OB appointment

## 2016-09-08 ENCOUNTER — Ambulatory Visit (INDEPENDENT_AMBULATORY_CARE_PROVIDER_SITE_OTHER): Payer: Medicaid Other | Admitting: Women's Health

## 2016-09-08 ENCOUNTER — Encounter: Payer: Self-pay | Admitting: Women's Health

## 2016-09-08 VITALS — BP 108/60 | HR 76 | Wt 192.0 lb

## 2016-09-08 DIAGNOSIS — Z1389 Encounter for screening for other disorder: Secondary | ICD-10-CM

## 2016-09-08 DIAGNOSIS — Z3A36 36 weeks gestation of pregnancy: Secondary | ICD-10-CM

## 2016-09-08 DIAGNOSIS — Z3483 Encounter for supervision of other normal pregnancy, third trimester: Secondary | ICD-10-CM

## 2016-09-08 DIAGNOSIS — Z331 Pregnant state, incidental: Secondary | ICD-10-CM

## 2016-09-08 LAB — POCT URINALYSIS DIPSTICK
Glucose, UA: NEGATIVE
KETONES UA: NEGATIVE
Leukocytes, UA: NEGATIVE
Nitrite, UA: NEGATIVE
PROTEIN UA: NEGATIVE
RBC UA: NEGATIVE

## 2016-09-08 NOTE — Progress Notes (Signed)
Low-risk OB appointment [id number] 1566w1d Estimated Date of Delivery: 10/05/16 BP 108/60   Pulse 76   Wt 192 lb (87.1 kg)   LMP 12/30/2015 (Approximate)   BMI 30.99 kg/m   BP, weight, and urine reviewed.  Refer to obstetrical flow sheet for FH & FHR.  Reports good fm.  Denies regular uc's, lof, vb, or uti s/s. No complaints. Wants waterbirth, discussed risks/benefits, consent signed today. Plans to use [facility]- is in process of contacting them- to do this asap Wants SVE: 3/th/-2, vtx Reviewed ptl s/s, fkc. Plan:  Continue routine obstetrical care  F/U in 1wk for OB appointment, won't need gbs (+urine)

## 2016-09-08 NOTE — Patient Instructions (Signed)
Call the office [phone]) or go to [facility] if:  You begin to have strong, frequent contractions  Your water breaks.  Sometimes it is a big gush of fluid, sometimes it is just a trickle that keeps getting your panties wet or running down your legs  You have vaginal bleeding.  It is normal to have a small amount of spotting if your cervix was checked.   You don't feel your baby moving like normal.  If you don't, get you something to eat and drink and lay down and focus on feeling your baby move.  You should feel at least 10 movements in 2 hours.  If you don't, you should call the office or go to [place] and Birth Information The normal length of a pregnancy is 39-41 weeks. Preterm labor is when labor starts before 37 completed weeks of pregnancy. What are the risk factors for preterm labor? Preterm labor is more likely to occur in women who:  Have certain infections during pregnancy such as a bladder infection, sexually transmitted infection, or infection inside the uterus (chorioamnionitis).  Have a shorter-than-normal cervix.  Have gone into preterm labor before.  Have had surgery on their cervix.  Are younger than age 39 or older than age 54.  Are African American.  Are pregnant with twins or multiple babies (multiple gestation).  Take street drugs or smoke while pregnant.  Do not gain enough weight while pregnant.  Became pregnant shortly after having been pregnant.  What are the symptoms of preterm labor? Symptoms of preterm labor include:  Cramps similar to those that can happen during a menstrual period. The cramps may happen with diarrhea.  Pain in the abdomen or lower back.  Regular uterine contractions that may feel like tightening of the abdomen.  A feeling of increased pressure in the pelvis.  Increased watery or bloody mucus discharge from the vagina.  Water breaking (ruptured amniotic sac).  Why is it important to  recognize signs of preterm labor? It is important to recognize signs of preterm labor because babies who are born prematurely may not be fully developed. This can put them at an increased risk for:  Long-term (chronic) heart and lung problems.  Difficulty immediately after birth with regulating body systems, including blood sugar, body temperature, heart rate, and breathing rate.  Bleeding in the brain.  Cerebral palsy.  Learning difficulties.  Death.  These risks are highest for babies who are born before 64 weeks of pregnancy. How is preterm labor treated? Treatment depends on the length of your pregnancy, your condition, and the health of your baby. It may involve:  Having a stitch (suture) placed in your cervix to prevent your cervix from opening too early (cerclage).  Taking or being given medicines, such as: ? Hormone medicines. These may be given early in pregnancy to help support the pregnancy. ? Medicine to stop contractions. ? Medicines to help mature the baby's lungs. These may be prescribed if the risk of delivery is high. ? Medicines to prevent your baby from developing cerebral palsy.  If the labor happens before 34 weeks of pregnancy, you may need to stay in the hospital. What should I do if I think I am in preterm labor? If you think that you are going into preterm labor, call your health care provider right away. How can I prevent preterm labor in future pregnancies? To increase your chance of having a full-term pregnancy:  Do not use  any tobacco products, such as cigarettes, chewing tobacco, and e-cigarettes. If you need help quitting, ask your health care provider.  Do not use street drugs or medicines that have not been prescribed to you during your pregnancy.  Talk with your health care provider before taking any herbal supplements, even if you have been taking them regularly.  Make sure you gain a healthy amount of weight during your pregnancy.  Watch  for infection. If you think that you might have an infection, get it checked right away.  Make sure to tell your health care provider if you have gone into preterm labor before.  This information is not intended to replace advice given to you by your health care provider. Make sure you discuss any questions you have with your health care provider. Document Released: 05/17/2003 Document Revised: 08/07/2015 Document Reviewed: 07/18/2015 Elsevier Interactive Patient Education  2018 [place]???  Why consider waterbirth? . Gentle birth for babies . Less pain medicine used in labor . May allow for passive descent/less pushing . May reduce perineal tears  . More mobility and instinctive maternal position changes . Increased maternal relaxation . Reduced blood pressure in labor  Is waterbirth safe? What are the risks of infection, drowning or other complications? . Infection o Very low risk (3.7 % for tub vs 4.8% for bed) o 7 in 8000 waterbirths with documented infection o Poorly cleaned equipment most common cause o Slightly lower group B strep transmission rate  . Drowning o Maternal:  - Very low risk   - Related to seizures or fainting o Newborn:  - Very low risk. No evidence of increased risk of respiratory problems in multiple large studies - Physiological protection from breathing under water - Avoid underwater birth if there are any fetal complications - Once baby's head is out of the water, keep it out.  . Birth complication o Some reports of cord trauma, but risk decreased by bringing baby to surface gradually o No evidence of increased risk of shoulder dystocia. Mothers can usually change positions faster in water than in a bed, possibly aiding the maneuvers to free the shoulder.  You must attend a Doren Custard class at [facility]  3rd Wednesday of every month from 7-9pm  Free  Register by calling [phone] or online at  VFederal.at  Bring [place] the certificate from the class  Waterbirth supplies needed for [facility] patients:  Our practice has a [employer] manager in a Box tub (Regular size) at the hospital that you can borrow  You will need to purchase an accessory kit that has all needed supplies through [place] [phone] for kit, $65 for liner=$179+tax) or online through [employer]is  Or you can purchase the supplies separately: o Single-use disposable tub liner for [employer] in a Box (REGULAR size) o New garden hose labeled "lead-free", "suitable for drinking water", "non-toxic" OR "water potable" o Garden hose to remove the dirty water o Electric drain pump to remove water (We recommend 792 gallon per hour or greater pump.)  o Fish net o Bathing suit top (optional) o Long-handled mirror (optional)  [employer]- sells EVERYTHING waterbirth related, accessory kits, tubs, etc  The [employer] ([facility]) this is a great service if you don't want to be responsible for the set up/take down of tub! Just call the [facility] and they will come do it for you for $200! This includes the rental fee for their tub, the accessory kit, set-up and take down  Things that would prevent you from having a waterbirth:  Premature, <37wks  Previous cesarean birth  Presence of thick meconium-stained fluid  Multiple gestation (Twins, triplets, etc.)  Uncontrolled diabetes  Hypertension  Heavy vaginal bleeding  Non-reassuring fetal heart rate  Active infection (MRSA, etc.)  If your labor has to be induced  Other risk issues identified by your obstetrical provider

## 2016-09-12 ENCOUNTER — Inpatient Hospital Stay (HOSPITAL_COMMUNITY)
Admission: AD | Admit: 2016-09-12 | Discharge: 2016-09-13 | Disposition: A | Discharge status: Home / Self Care | Payer: Medicaid Other | Source: Ambulatory Visit | Attending: Obstetrics and Gynecology | Admitting: Obstetrics and Gynecology

## 2016-09-12 ENCOUNTER — Telehealth: Payer: Self-pay | Admitting: Obstetrics & Gynecology

## 2016-09-12 ENCOUNTER — Inpatient Hospital Stay (HOSPITAL_COMMUNITY): Payer: Medicaid Other

## 2016-09-12 ENCOUNTER — Encounter (HOSPITAL_COMMUNITY): Payer: Self-pay | Admitting: *Deleted

## 2016-09-12 DIAGNOSIS — O99323 Drug use complicating pregnancy, third trimester: Secondary | ICD-10-CM | POA: Diagnosis not present

## 2016-09-12 DIAGNOSIS — A09 Infectious gastroenteritis and colitis, unspecified: Secondary | ICD-10-CM | POA: Diagnosis not present

## 2016-09-12 DIAGNOSIS — F17210 Nicotine dependence, cigarettes, uncomplicated: Secondary | ICD-10-CM | POA: Diagnosis not present

## 2016-09-12 DIAGNOSIS — Z3A36 36 weeks gestation of pregnancy: Secondary | ICD-10-CM | POA: Insufficient documentation

## 2016-09-12 DIAGNOSIS — E876 Hypokalemia: Secondary | ICD-10-CM | POA: Diagnosis not present

## 2016-09-12 DIAGNOSIS — O99333 Smoking (tobacco) complicating pregnancy, third trimester: Secondary | ICD-10-CM | POA: Insufficient documentation

## 2016-09-12 DIAGNOSIS — O368390 Maternal care for abnormalities of the fetal heart rate or rhythm, unspecified trimester, not applicable or unspecified: Secondary | ICD-10-CM

## 2016-09-12 DIAGNOSIS — F1290 Cannabis use, unspecified, uncomplicated: Secondary | ICD-10-CM | POA: Diagnosis not present

## 2016-09-12 DIAGNOSIS — K529 Noninfective gastroenteritis and colitis, unspecified: Secondary | ICD-10-CM

## 2016-09-12 DIAGNOSIS — O9989 Other specified diseases and conditions complicating pregnancy, childbirth and the puerperium: Secondary | ICD-10-CM | POA: Diagnosis not present

## 2016-09-12 DIAGNOSIS — O99613 Diseases of the digestive system complicating pregnancy, third trimester: Secondary | ICD-10-CM | POA: Insufficient documentation

## 2016-09-12 DIAGNOSIS — E860 Dehydration: Secondary | ICD-10-CM | POA: Insufficient documentation

## 2016-09-12 DIAGNOSIS — R5381 Other malaise: Secondary | ICD-10-CM | POA: Diagnosis present

## 2016-09-12 LAB — URINALYSIS, ROUTINE W REFLEX MICROSCOPIC
BILIRUBIN URINE: NEGATIVE
GLUCOSE, UA: NEGATIVE mg/dL
HGB URINE DIPSTICK: NEGATIVE
KETONES UR: 80 mg/dL — AB
NITRITE: NEGATIVE
PH: 5 (ref 5.0–8.0)
Protein, ur: 100 mg/dL — AB
SPECIFIC GRAVITY, URINE: 1.024 (ref 1.005–1.030)

## 2016-09-12 LAB — CBC WITH DIFFERENTIAL/PLATELET
BASOS PCT: 0 %
Basophils Absolute: 0 10*3/uL (ref 0.0–0.1)
EOS ABS: 0 10*3/uL (ref 0.0–0.7)
Eosinophils Relative: 0 %
HCT: 36.9 % (ref 36.0–46.0)
HEMOGLOBIN: 13 g/dL (ref 12.0–15.0)
LYMPHS ABS: 0.7 10*3/uL (ref 0.7–4.0)
Lymphocytes Relative: 5 %
MCH: 31.3 pg (ref 26.0–34.0)
MCHC: 35.2 g/dL (ref 30.0–36.0)
MCV: 88.9 fL (ref 78.0–100.0)
MONOS PCT: 1 %
Monocytes Absolute: 0.2 10*3/uL (ref 0.1–1.0)
NEUTROS PCT: 94 %
Neutro Abs: 14.2 10*3/uL — ABNORMAL HIGH (ref 1.7–7.7)
Platelets: 120 10*3/uL — ABNORMAL LOW (ref 150–400)
RBC: 4.15 MIL/uL (ref 3.87–5.11)
RDW: 13.7 % (ref 11.5–15.5)
WBC: 15.1 10*3/uL — ABNORMAL HIGH (ref 4.0–10.5)

## 2016-09-12 LAB — BASIC METABOLIC PANEL
Anion gap: 12 (ref 5–15)
BUN: 7 mg/dL (ref 6–20)
CALCIUM: 8.1 mg/dL — AB (ref 8.9–10.3)
CHLORIDE: 106 mmol/L (ref 101–111)
CO2: 19 mmol/L — AB (ref 22–32)
CREATININE: 0.53 mg/dL (ref 0.44–1.00)
GFR calc non Af Amer: >60 mL/min (ref >60)
Glucose, Bld: 107 mg/dL — ABNORMAL HIGH (ref 65–99)
Potassium: 3 mmol/L — ABNORMAL LOW (ref 3.5–5.1)
SODIUM: 137 mmol/L (ref 135–145)

## 2016-09-12 MED ORDER — PROMETHAZINE HCL 25 MG/ML IJ SOLN
25.0000 mg | Freq: Once | INTRAMUSCULAR | Status: AC
  Administered 2016-09-12: 25 mg via INTRAVENOUS
  Filled 2016-09-12: qty 1

## 2016-09-12 MED ORDER — ONDANSETRON 8 MG PO TBDP: 8.0000 mg | ORAL_TABLET | Freq: Three times a day (TID) | ORAL | 0 refills | Status: DC | PRN

## 2016-09-12 MED ORDER — LACTATED RINGERS IV BOLUS (SEPSIS)
1000.0000 mL | Freq: Once | INTRAVENOUS | Status: AC
  Administered 2016-09-12: 1000 mL via INTRAVENOUS

## 2016-09-12 MED ORDER — ACETAMINOPHEN 500 MG PO TABS
1000.0000 mg | ORAL_TABLET | Freq: Once | ORAL | Status: AC
  Administered 2016-09-12: 1000 mg via ORAL
  Filled 2016-09-12: qty 2

## 2016-09-12 MED ORDER — ONDANSETRON 4 MG PO TBDP: 4.0000 mg | ORAL_TABLET | Freq: Once | ORAL | Status: DC

## 2016-09-12 MED ORDER — POTASSIUM CHLORIDE 2 MEQ/ML IV SOLN
INTRAVENOUS | Status: DC
  Administered 2016-09-12: 19:00:00 via INTRAVENOUS
  Filled 2016-09-12 (×7): qty 1000

## 2016-09-12 MED ORDER — DEXTROSE 5 % IN LACTATED RINGERS IV BOLUS
1000.0000 mL | Freq: Once | INTRAVENOUS | Status: AC
  Administered 2016-09-12: 1000 mL via INTRAVENOUS

## 2016-09-12 MED ORDER — PROMETHAZINE HCL 25 MG PO TABS: 25.0000 mg | ORAL_TABLET | Freq: Four times a day (QID) | ORAL | 0 refills | Status: DC | PRN

## 2016-09-12 MED ORDER — FAMOTIDINE IN NACL 20-0.9 MG/50ML-% IV SOLN
20.0000 mg | Freq: Once | INTRAVENOUS | Status: AC
  Administered 2016-09-12: 20 mg via INTRAVENOUS
  Filled 2016-09-12: qty 50

## 2016-09-12 MED ORDER — KCL-LACTATED RINGERS 20 MEQ/L IV SOLN: INTRAVENOUS | Status: DC

## 2016-09-12 NOTE — Telephone Encounter (Signed)
Patient called with complaints of nausea, vomiting and diarrhea since last night. No contractions, leaking and baby is active.Informed patient they symptoms could be an early sign of labor or just a GI virus. Advised patient to try sips of fluids to avoid dehydration, rest when able and to continue to monitor. If bleeding or leaking occur to call us back or go to [facility] Verbalized understanding.

## 2016-09-12 NOTE — MAU Provider Note (Signed)
History     CSN: [id number]  Arrival date and time: 09/12/16 1607   First Provider Initiated Contact with Patient 09/12/16 1653      Chief Complaint  Patient presents with  . Emesis  . Diarrhea  . Contractions   Alyssa Massey is a 31 y.o. [id number] at [redacted]w[redacted]d presenting with 2 day history general malaise with nausea. She woke this morning with vomiting and diarrhea, both recurring several times. Last episode was about 3 hours ago. She has retained only sips of water and coke today. Tried Tums for indigestion but vomited the tab.  No vaginal bleeding or leakage of fluid. Good fetal movement.    Emesis   This is a new problem. The current episode started today. The problem occurs 5 to 10 times per day. The problem has been unchanged. The emesis has an appearance of bile. There has been no fever. Associated symptoms include abdominal pain and diarrhea. Pertinent negatives include no chills, dizziness, fever, headaches or URI. Associated symptoms comments: Cramps and irregular contractions. Risk factors: No known ill contacts  Treatments tried: Tums.  [facility]atal [facility] at [facility], pregnancy uncomplicated by cigarette and marijuana smoking, GBS bacteruria  OB History  Gravida Para Term Preterm AB Living  3 2 2  0 0 2  SAB TAB Ectopic Multiple Live Births  0 0 0 0 2    # Outcome Date GA Lbr Len/2nd Weight Sex Delivery Anes PTL Lv  3 Current           2 Term 09/10/10 [redacted]w[redacted]d  8 lb 1 oz (3.657 kg) F Vag-Spont EPI N LIV  1 Term 12/03/07 [redacted]w[redacted]d  8 lb 3 oz (3.714 kg) M Vag-Spont EPI N LIV       Past Medical History:  Diagnosis Date  . Medical history non-contributory     Past Surgical History:  Procedure Laterality Date  . NO PAST SURGERIES      Family History  Problem Relation Age of Onset  . Other Mother        3 blockages in heart  . Alcohol abuse Father   . Cancer Paternal Grandfather   . Diabetes Paternal Grandmother   . Hypertension Paternal Grandmother   . Other Maternal  Grandmother        brain tumor  . Alzheimer's disease Maternal Grandfather     Social History  Substance Use Topics  . Smoking status: Current Every Day Smoker    Packs/day: 0.50    Years: 14.00    Types: Cigarettes  . Smokeless tobacco: Never Used  . Alcohol use No    Allergies: No Known Allergies  Prescriptions Prior to Admission  Medication Sig Dispense Refill Last Dose  . flintstones complete (FLINTSTONES) 60 MG chewable tablet Chew 1 tablet by mouth daily.   Taking    Review of Systems  Constitutional: Negative for chills and fever.  Gastrointestinal: Positive for abdominal pain, diarrhea and vomiting.  Genitourinary: Negative for dysuria, flank pain, frequency, urgency and vaginal bleeding.  Neurological: Negative for dizziness and headaches.   Physical Exam   Blood pressure 111/61, pulse (!) 126, temperature 99.1 F (37.3 C), temperature source Oral, resp. rate 18, last menstrual period 12/30/2015, SpO2 97 %. Vitals:   09/12/16 1923 09/12/16 1926 09/12/16 2026 09/12/16 2053  BP: 120/70   111/61  Pulse: (!) 128 (!) 120  (!) 126  Resp: 18   18  Temp:   99.1 F (37.3 C)   TempSrc:   Oral  SpO2:    97%   Physical Exam  Nursing note and vitals reviewed. Constitutional: She is oriented to person, place, and time. She appears well-developed and well-nourished.  Appears pale, fatigued  HENT:  Head: Normocephalic.  Eyes: No scleral icterus.  Neck: Neck supple. No thyromegaly present.  Cardiovascular: Regular rhythm.   Tachycardic  Respiratory: Effort normal. No respiratory distress.  GI: Soft. There is no tenderness.  Genitourinary:  Genitourinary Comments: Negative CVAT  Musculoskeletal: Normal range of motion.  Neurological: She is alert and oriented to person, place, and time.  Skin: Skin is warm and dry. There is pallor.  Psychiatric: She has a normal mood and affect. Her behavior is normal.    MAU Course  Procedures Fetal Monitoring Baseline  170-175 initially, moderate variability, accelerations present, intermittent repetitive mild decelerations  Toco: intermittent mild irregular UCs After IV #1: FHR baseline 160-165, moderate variablitiy, accelerations present, occasional mild variable deceleration; UI 1908: variable to 60bpm30 seconds  Results for orders placed or performed during the hospital encounter of 09/12/16 (from the past 24 hour(s))  Urinalysis, Routine w reflex microscopic     Status: Abnormal   Collection Time: 09/12/16  4:26 PM  Result Value Ref Range   Color, Urine YELLOW YELLOW   APPearance HAZY (A) CLEAR   Specific Gravity, Urine 1.024 1.005 - 1.030   pH 5.0 5.0 - 8.0   Glucose, UA NEGATIVE NEGATIVE mg/dL   Hgb urine dipstick NEGATIVE NEGATIVE   Bilirubin Urine NEGATIVE NEGATIVE   Ketones, ur 80 (A) NEGATIVE mg/dL   Protein, ur 409 (A) NEGATIVE mg/dL   Nitrite NEGATIVE NEGATIVE   Leukocytes, UA SMALL (A) NEGATIVE   RBC / HPF 0-5 0 - 5 RBC/hpf   WBC, UA 6-30 0 - 5 WBC/hpf   Bacteria, UA FEW (A) NONE SEEN   Squamous Epithelial / LPF 0-5 (A) NONE SEEN   Mucous PRESENT   CBC with Differential     Status: Abnormal   Collection Time: 09/12/16  5:00 PM  Result Value Ref Range   WBC 15.1 (H) 4.0 - 10.5 K/uL   RBC 4.15 3.87 - 5.11 MIL/uL   Hemoglobin 13.0 12.0 - 15.0 g/dL   HCT 81.1 91.4 - 78.2 %   MCV 88.9 78.0 - 100.0 fL   MCH 31.3 26.0 - 34.0 pg   MCHC 35.2 30.0 - 36.0 g/dL   RDW 95.6 21.3 - 08.6 %   Platelets 120 (L) 150 - 400 K/uL   Neutrophils Relative % 94 %   Neutro Abs 14.2 (H) 1.7 - 7.7 K/uL   Lymphocytes Relative 5 %   Lymphs Abs 0.7 0.7 - 4.0 K/uL   Monocytes Relative 1 %   Monocytes Absolute 0.2 0.1 - 1.0 K/uL   Eosinophils Relative 0 %   Eosinophils Absolute 0.0 0.0 - 0.7 K/uL   Basophils Relative 0 %   Basophils Absolute 0.0 0.0 - 0.1 K/uL  Basic metabolic panel     Status: Abnormal   Collection Time: 09/12/16  5:00 PM  Result Value Ref Range   Sodium 137 135 - 145 mmol/L    Potassium 3.0 (L) 3.5 - 5.1 mmol/L   Chloride 106 101 - 111 mmol/L   CO2 19 (L) 22 - 32 mmol/L   Glucose, Bld 107 (H) 65 - 99 mg/dL   BUN 7 6 - 20 mg/dL   Creatinine, Ser 5.78 0.44 - 1.00 mg/dL   Calcium 8.1 (L) 8.9 - 10.3 mg/dL   GFR calc non  Af Amer >60 >60 mL/min   GFR calc Af Amer >60 >60 mL/min   Anion gap 12 5 - 15  urine culture sent  Vomited x1 LR 1000 IVF bolus given. IV #2 D5LR with KCl 20mEq Phenergan 25 mg IV famotidine 20mg  IV 1850: Nausea resolved.Tylenol 1000mg  po  2100: Fetal tachycardia persistent BPP 8/8  Consulted Dr. Alysia PennaErvin> will continue IV rehydration Sharen CounterLisa Leftwich-Kirby CNM to assume care at 2110 5 minute prolonged deceleration @ 2130. Patient turned to her left side. Consulted with Dr. Alysia PennaErvin who reviewed fetal tracing. At this point patient has received 1.5 liters of fluid. Due to Urine showing >80 ketones. Will bolus an additional bag of D5LR.  Care resumed by Venia CarbonJennifer Rasch NP @ 2200 Reactive NST + accels, no decels. Baseline 145 bpm Patient feeling better, tolerating PO fluids.   Assessment and Plan  G3P2002 at 8374w5d  1. Gastroenteritis, infectious, presumed   2. Dehydration, severe   3. Hypokalemia   4. Variable fetal heart rate decelerations, antepartum     P:  Discharge home in stable condition Rx: Zofran, Phenergan, KDUR X 5 days Return to MAU if symptoms worsen BRAT diet Increase PO fluids  Rasch, Harolyn RutherfordJennifer I, NP 09/13/2016 12:07 AM   Hurshel PartyLeftwich-Kirby, Lisa A, CNM 09/12/2016 10:10 PM

## 2016-09-12 NOTE — MAU Note (Signed)
Pt didn't feel well yesterday, started having vomiting & diarrhea this morning.  Has started having contractions in the last couple of hours.  Denies bleeding or LOF.  Decreased FM today.  Denies fever.

## 2016-09-12 NOTE — Telephone Encounter (Signed)
Pt called stating that she is almost 37 weeks and she woke up this morning very sick with Diarrhea. Pt would like to know if she needs to go to the hospital or should she come see us. Please contact pt

## 2016-09-13 MED ORDER — POTASSIUM CHLORIDE ER 10 MEQ PO TBCR: 10.0000 meq | EXTENDED_RELEASE_TABLET | Freq: Every day | ORAL | 0 refills | Status: DC

## 2016-09-13 NOTE — Progress Notes (Signed)
Written and verbal d/c instructions given and understanding voiced. 

## 2016-09-13 NOTE — Discharge Instructions (Signed)
Bland Diet A bland diet consists of foods that do not have a lot of fat or fiber. Foods without fat or fiber are easier for the body to digest. They are also less likely to irritate your mouth, throat, stomach, and other parts of your gastrointestinal tract. A bland diet is sometimes called a BRAT diet. What is my plan? Your health care provider or dietitian may recommend specific changes to your diet to prevent and treat your symptoms, such as:  Eating small meals often.  Cooking food until it is soft enough to chew easily.  Chewing your food well.  Drinking fluids slowly.  Not eating foods that are very spicy, sour, or fatty.  Not eating citrus fruits, such as oranges and grapefruit.  What do I need to know about this diet?  Eat a variety of foods from the bland diet food list.  Do not follow a bland diet longer than you have to.  Ask your health care provider whether you should take vitamins. What foods can I eat? Grains  Hot cereals, such as cream of wheat. Bread, crackers, or tortillas made from refined white flour. Rice. Vegetables Canned or cooked vegetables. Mashed or boiled potatoes. Fruits Bananas. Applesauce. Other types of cooked or canned fruit with the skin and seeds removed, such as canned peaches or pears. Meats and Other Protein Sources Scrambled eggs. Creamy peanut butter or other nut butters. Lean, well-cooked meats, such as chicken or fish. Tofu. Soups or broths. Dairy Low-fat dairy products, such as milk, cottage cheese, or yogurt. Beverages Water. Herbal tea. Apple juice. Sweets and Desserts Pudding. Custard. Fruit gelatin. Ice cream. Fats and Oils Mild salad dressings. Canola or olive oil. The items listed above may not be a complete list of allowed foods or beverages. Contact your dietitian for more options. What foods are not recommended? Foods and ingredients that are often not recommended include:  Spicy foods, such as hot sauce or  salsa.  Fried foods.  Sour foods, such as pickled or fermented foods.  Raw vegetables or fruits, especially citrus or berries.  Caffeinated drinks.  Alcohol.  Strongly flavored seasonings or condiments.  The items listed above may not be a complete list of foods and beverages that are not allowed. Contact your dietitian for more information. This information is not intended to replace advice given to you by your health care provider. Make sure you discuss any questions you have with your health care provider. Document Released: 06/18/2015 Document Revised: 08/02/2015 Document Reviewed: 03/08/2014 Elsevier Interactive Patient Education  2018 [employer]. Norovirus Infection A norovirus infection is caused by exposure to a virus in a group of similar viruses (noroviruses). This type of infection causes inflammation in your stomach and intestines (gastroenteritis). Norovirus is the most common cause of gastroenteritis. It also causes food poisoning. Anyone can get a norovirus infection. It spreads very easily (contagious). You can get it from contaminated food, water, surfaces, or other people. Norovirus is found in the stool or vomit of infected people. You can spread the infection as soon as you feel sick until 2 weeks after you recover. Symptoms usually begin within 2 days after you become infected. Most norovirus symptoms affect the digestive system. What are the causes? Norovirus infection is caused by contact with norovirus. You can catch norovirus if you:  Eat or drink something contaminated with norovirus.  Touch surfaces or objects contaminated with norovirus and then put your hand in your mouth.  Have direct contact with an infected person  who has symptoms.  Share food, drink, or utensils with someone with who is sick with norovirus.  What are the signs or symptoms? Symptoms of norovirus may include:  Nausea.  Vomiting.  Diarrhea.  Stomach  cramps.  Fever.  Chills.  Headache.  Muscle aches.  Tiredness.  How is this diagnosed? Your health care provider may suspect norovirus based on your symptoms and physical exam. Your health care provider may also test a sample of your stool or vomit for the virus. How is this treated? There is no specific treatment for norovirus. Most people get better without treatment in about 2 days. Follow these instructions at home:  Replace lost fluids by drinking plenty of water or rehydration fluids containing important minerals called electrolytes. This prevents dehydration. Drink enough fluid to keep your urine clear or pale yellow.  Do not prepare food for others while you are infected. Wait at least 3 days after recovering from the illness to do that. How is this prevented?  Wash your hands often, especially after using the toilet or changing a diaper.  Wash fruits and vegetables thoroughly before preparing or serving them.  Throw out any food that a sick person may have touched.  Disinfect contaminated surfaces immediately after someone in the household has been sick. Use a bleach-based household cleaner.  Immediately remove and wash soiled clothes or sheets. Contact a health care provider if:  Your vomiting, diarrhea, and stomach pain is getting worse.  Your symptoms of norovirus do not go away after 2-3 days. Get help right away if: You develop symptoms of dehydration that do not improve with fluid replacement. This may include:  Excessive sleepiness.  Lack of tears.  Dry mouth.  Dizziness when standing.  Weak pulse.  This information is not intended to replace advice given to you by your health care provider. Make sure you discuss any questions you have with your health care provider. Document Released: 05/17/2002 Document Revised: 08/02/2015 Document Reviewed: 08/04/2013 Elsevier Interactive Patient Education  2017 [employer]

## 2016-09-14 LAB — URINE CULTURE
Culture: <10000 — AB
Special Requests: NORMAL

## 2016-09-16 ENCOUNTER — Ambulatory Visit (INDEPENDENT_AMBULATORY_CARE_PROVIDER_SITE_OTHER): Payer: Medicaid Other | Admitting: Obstetrics & Gynecology

## 2016-09-16 ENCOUNTER — Encounter: Payer: Self-pay | Admitting: Obstetrics & Gynecology

## 2016-09-16 VITALS — BP 114/70 | HR 85 | Wt 196.0 lb

## 2016-09-16 DIAGNOSIS — Z331 Pregnant state, incidental: Secondary | ICD-10-CM

## 2016-09-16 DIAGNOSIS — Z1389 Encounter for screening for other disorder: Secondary | ICD-10-CM

## 2016-09-16 DIAGNOSIS — Z3483 Encounter for supervision of other normal pregnancy, third trimester: Secondary | ICD-10-CM

## 2016-09-16 DIAGNOSIS — Z3A37 37 weeks gestation of pregnancy: Secondary | ICD-10-CM

## 2016-09-16 LAB — POCT URINALYSIS DIPSTICK
Blood, UA: NEGATIVE
Glucose, UA: NEGATIVE
Ketones, UA: NEGATIVE
Leukocytes, UA: NEGATIVE
Nitrite, UA: NEGATIVE
Protein, UA: NEGATIVE

## 2016-09-16 NOTE — Addendum Note (Signed)
Addended by: Federico FlakeNES, Marjani Kobel A on: 09/16/2016 11:43 AM   Modules accepted: Orders

## 2016-09-16 NOTE — Progress Notes (Signed)
[id number] 2941w2d Estimated Date of Delivery: 10/05/16  Blood pressure 114/70, pulse 85, weight 196 lb (88.9 kg), last menstrual period 12/30/2015.   BP weight and urine results all reviewed and noted.  Please refer to the obstetrical flow sheet for the fundal height and fetal heart rate documentation:  Patient reports good fetal movement, denies any bleeding and no rupture of membranes symptoms or regular contractions. Patient is without complaints. All questions were answered.  Orders Placed This Encounter  Procedures  . POCT urinalysis dipstick    Plan:  Continued routine obstetrical care, cultures done  Return in about 1 week (around 09/23/2016) for LROB.

## 2016-09-18 LAB — GC/CHLAMYDIA PROBE AMP
CHLAMYDIA, DNA PROBE: NEGATIVE
NEISSERIA GONORRHOEAE BY PCR: NEGATIVE

## 2016-09-19 ENCOUNTER — Inpatient Hospital Stay (HOSPITAL_COMMUNITY)
Admission: AD | Admit: 2016-09-19 | Discharge: 2016-09-19 | Disposition: A | Discharge status: Home / Self Care | Payer: Medicaid Other | Source: Ambulatory Visit | Attending: Obstetrics & Gynecology | Admitting: Obstetrics & Gynecology

## 2016-09-19 DIAGNOSIS — F17200 Nicotine dependence, unspecified, uncomplicated: Secondary | ICD-10-CM

## 2016-09-19 DIAGNOSIS — Z3A37 37 weeks gestation of pregnancy: Secondary | ICD-10-CM | POA: Diagnosis not present

## 2016-09-19 DIAGNOSIS — O471 False labor at or after 37 completed weeks of gestation: Secondary | ICD-10-CM | POA: Diagnosis not present

## 2016-09-19 DIAGNOSIS — O479 False labor, unspecified: Secondary | ICD-10-CM

## 2016-09-19 NOTE — MAU Note (Signed)
I have communicated with Dr. Mosetta PuttFeng and reviewed vital signs:  Vitals:   09/19/16 2102 09/19/16 2241  BP: 134/81 133/66  Pulse: (!) 107 97  Resp: 18 18  Temp: 98.5 F (36.9 C)     Vaginal exam:  Dilation: 2.5 Effacement (%): 40 Cervical Position: Posterior Station: Ballotable Exam by:: Kimie Pidcock, RN ,   Also reviewed contraction pattern and that non-stress test is reactive.  It has been documented that patient is contracting every 2-4 minutes with no cervical change over 1 hours not indicating active labor.  Patient denies any other complaints.  Based on this report provider has given order for discharge.  A discharge order and diagnosis entered by a provider.   Labor discharge instructions reviewed with patient.

## 2016-09-19 NOTE — MAU Note (Signed)
Patient here for labor evaluation, contractions started around 1800 today, 3-4 mins apart. Pt. Denies sudden gush of fluid, denies vaginal bleeding and positive for fetal movement.  EFM applied - FHR 140s, toco applied - contraction palpated.

## 2016-09-24 ENCOUNTER — Ambulatory Visit (INDEPENDENT_AMBULATORY_CARE_PROVIDER_SITE_OTHER): Payer: Medicaid Other | Admitting: Obstetrics and Gynecology

## 2016-09-24 ENCOUNTER — Encounter: Payer: Self-pay | Admitting: Obstetrics and Gynecology

## 2016-09-24 VITALS — BP 110/70 | HR 100 | Wt 198.0 lb

## 2016-09-24 DIAGNOSIS — Z331 Pregnant state, incidental: Secondary | ICD-10-CM

## 2016-09-24 DIAGNOSIS — Z3483 Encounter for supervision of other normal pregnancy, third trimester: Secondary | ICD-10-CM | POA: Diagnosis not present

## 2016-09-24 DIAGNOSIS — Z3A38 38 weeks gestation of pregnancy: Secondary | ICD-10-CM

## 2016-09-24 DIAGNOSIS — Z1389 Encounter for screening for other disorder: Secondary | ICD-10-CM

## 2016-09-24 LAB — POCT URINALYSIS DIPSTICK
Blood, UA: NEGATIVE
Glucose, UA: NEGATIVE
Ketones, UA: NEGATIVE
Leukocytes, UA: NEGATIVE
Nitrite, UA: NEGATIVE
PROTEIN UA: NEGATIVE

## 2016-09-24 NOTE — Progress Notes (Signed)
Lindie Arline Asp Surber is a 31 y.o. female [id number]  Estimated Date of Delivery: 10/05/16 LROB 6932w3d  Chief Complaint  Patient presents with  . Routine Prenatal Visit    having pain  ____  Patient complaints: No complaints, Pt will take Pills or Nuvaring for BC PP. Patient reports good fetal movement,                          denies any bleeding , rupture of membranes,or regular contractions.  Blood pressure 110/70, pulse 100, weight 198 lb (89.8 kg), last menstrual period 12/30/2015.   Urine results: negative refer to the ob flow sheet for FH and FHR, ,                          Physical Examination: General appearance - alert, well appearing, and in no distress                                      Abdomen - FH 39cm ,                                                         -FHR 138bpm                                                         soft, nontender, nondistended, no masses or organomegaly                                      Pelvic -  Cervix is 2 cm, thick, posterior                                            Questions were answered. Assessment: LROB G3P2002 @ 6432w3d Estimated Date of Delivery: 10/05/16   Plan:  Continued routine obstetrical care, LROB  F/u in 1 weeks for LROB   By signing my name below, I, Izna Ahmed, attest that this documentation has been prepared under the direction and in the presence of Tilda BurrowFerguson, Kidus Delman V, MD. Electronically Signed: Redge GainerIzna Ahmed, ED Scribe. 09/24/16. 10:56 AM.  I personally performed the services described in this documentation, which was SCRIBED in my presence. The recorded information has been reviewed and considered accurate. It has been edited as necessary during review. Tilda BurrowFERGUSON,Jazel Nimmons V, MD

## 2016-10-01 ENCOUNTER — Inpatient Hospital Stay (HOSPITAL_COMMUNITY)
Admission: AD | Admit: 2016-10-01 | Discharge: 2016-10-02 | DRG: 775 | Disposition: A | Discharge status: Home / Self Care | Payer: Medicaid Other | Source: Ambulatory Visit | Attending: Family Medicine | Admitting: Family Medicine

## 2016-10-01 ENCOUNTER — Encounter (HOSPITAL_COMMUNITY): Payer: Self-pay

## 2016-10-01 ENCOUNTER — Encounter: Payer: Medicaid Other | Admitting: Women's Health

## 2016-10-01 DIAGNOSIS — F17210 Nicotine dependence, cigarettes, uncomplicated: Secondary | ICD-10-CM | POA: Diagnosis present

## 2016-10-01 DIAGNOSIS — O99824 Streptococcus B carrier state complicating childbirth: Secondary | ICD-10-CM | POA: Diagnosis present

## 2016-10-01 DIAGNOSIS — O99334 Smoking (tobacco) complicating childbirth: Secondary | ICD-10-CM | POA: Diagnosis present

## 2016-10-01 DIAGNOSIS — Z3A39 39 weeks gestation of pregnancy: Secondary | ICD-10-CM

## 2016-10-01 DIAGNOSIS — O26893 Other specified pregnancy related conditions, third trimester: Secondary | ICD-10-CM | POA: Diagnosis present

## 2016-10-01 DIAGNOSIS — F1290 Cannabis use, unspecified, uncomplicated: Secondary | ICD-10-CM | POA: Diagnosis present

## 2016-10-01 DIAGNOSIS — F17200 Nicotine dependence, unspecified, uncomplicated: Secondary | ICD-10-CM

## 2016-10-01 DIAGNOSIS — Z3483 Encounter for supervision of other normal pregnancy, third trimester: Secondary | ICD-10-CM

## 2016-10-01 DIAGNOSIS — O99324 Drug use complicating childbirth: Secondary | ICD-10-CM | POA: Diagnosis present

## 2016-10-01 LAB — CBC
HCT: 34.6 % — ABNORMAL LOW (ref 36.0–46.0)
HEMATOCRIT: 31.3 % — AB (ref 36.0–46.0)
HEMOGLOBIN: 12.1 g/dL (ref 12.0–15.0)
Hemoglobin: 11 g/dL — ABNORMAL LOW (ref 12.0–15.0)
MCH: 30.7 pg (ref 26.0–34.0)
MCH: 31.5 pg (ref 26.0–34.0)
MCHC: 35 g/dL (ref 30.0–36.0)
MCHC: 35.1 g/dL (ref 30.0–36.0)
MCV: 87.8 fL (ref 78.0–100.0)
MCV: 89.7 fL (ref 78.0–100.0)
PLATELETS: 140 10*3/uL — AB (ref 150–400)
Platelets: 137 10*3/uL — ABNORMAL LOW (ref 150–400)
RBC: 3.94 MIL/uL (ref 3.87–5.11)
RBC: 3.49 MIL/uL — AB (ref 3.87–5.11)
RDW: 13.9 % (ref 11.5–15.5)
RDW: 13.8 % (ref 11.5–15.5)
WBC: 13.8 10*3/uL — ABNORMAL HIGH (ref 4.0–10.5)
WBC: 20.2 10*3/uL — ABNORMAL HIGH (ref 4.0–10.5)

## 2016-10-01 LAB — RPR: RPR: NONREACTIVE

## 2016-10-01 LAB — POCT FERN TEST: POCT FERN TEST: POSITIVE

## 2016-10-01 LAB — OB RESULTS CONSOLE GBS: STREP GROUP B AG: POSITIVE

## 2016-10-01 MED ORDER — IBUPROFEN 600 MG PO TABS
600.0000 mg | ORAL_TABLET | Freq: Four times a day (QID) | ORAL | Status: DC
  Administered 2016-10-01 – 2016-10-02 (×5): 600 mg via ORAL
  Filled 2016-10-01 (×4): qty 1

## 2016-10-01 MED ORDER — OXYTOCIN 40 UNITS IN LACTATED RINGERS INFUSION - SIMPLE MED
2.5000 [IU]/h | INTRAVENOUS | Status: DC
  Administered 2016-10-01: 2.5 [IU]/h via INTRAVENOUS
  Filled 2016-10-01: qty 1000

## 2016-10-01 MED ORDER — NALBUPHINE HCL 10 MG/ML IJ SOLN
5.0000 mg | INTRAMUSCULAR | Status: DC | PRN
  Filled 2016-10-01: qty 0.5

## 2016-10-01 MED ORDER — DIBUCAINE 1 % RE OINT: 1.0000 "application " | TOPICAL_OINTMENT | RECTAL | Status: DC | PRN

## 2016-10-01 MED ORDER — ONDANSETRON HCL 4 MG/2ML IJ SOLN: 4.0000 mg | INTRAMUSCULAR | Status: DC | PRN

## 2016-10-01 MED ORDER — SODIUM CHLORIDE 0.9 % IV SOLN
2.0000 g | Freq: Once | INTRAVENOUS | Status: AC
  Administered 2016-10-01: 2 g via INTRAVENOUS
  Filled 2016-10-01: qty 2000

## 2016-10-01 MED ORDER — ONDANSETRON HCL 4 MG/2ML IJ SOLN: 4.0000 mg | Freq: Four times a day (QID) | INTRAMUSCULAR | Status: DC | PRN

## 2016-10-01 MED ORDER — PRENATAL MULTIVITAMIN CH
1.0000 | ORAL_TABLET | Freq: Every day | ORAL | Status: DC
  Administered 2016-10-01: 1 via ORAL
  Filled 2016-10-01: qty 1

## 2016-10-01 MED ORDER — LACTATED RINGERS IV SOLN: 500.0000 mL | INTRAVENOUS | Status: DC | PRN

## 2016-10-01 MED ORDER — ZOLPIDEM TARTRATE 5 MG PO TABS: 5.0000 mg | ORAL_TABLET | Freq: Every evening | ORAL | Status: DC | PRN

## 2016-10-01 MED ORDER — RHO D IMMUNE GLOBULIN 1500 UNIT/2ML IJ SOSY
300.0000 ug | PREFILLED_SYRINGE | Freq: Once | INTRAMUSCULAR | Status: AC
  Administered 2016-10-01: 300 ug via INTRAVENOUS
  Filled 2016-10-01: qty 2

## 2016-10-01 MED ORDER — DIPHENHYDRAMINE HCL 25 MG PO CAPS: 25.0000 mg | ORAL_CAPSULE | Freq: Four times a day (QID) | ORAL | Status: DC | PRN

## 2016-10-01 MED ORDER — SIMETHICONE 80 MG PO CHEW: 80.0000 mg | CHEWABLE_TABLET | ORAL | Status: DC | PRN

## 2016-10-01 MED ORDER — OXYCODONE-ACETAMINOPHEN 5-325 MG PO TABS: 1.0000 | ORAL_TABLET | ORAL | Status: DC | PRN

## 2016-10-01 MED ORDER — ONDANSETRON HCL 4 MG PO TABS: 4.0000 mg | ORAL_TABLET | ORAL | Status: DC | PRN

## 2016-10-01 MED ORDER — LIDOCAINE HCL (PF) 1 % IJ SOLN
30.0000 mL | INTRAMUSCULAR | Status: DC | PRN
  Filled 2016-10-01: qty 30

## 2016-10-01 MED ORDER — SOD CITRATE-CITRIC ACID 500-334 MG/5ML PO SOLN: 30.0000 mL | ORAL | Status: DC | PRN

## 2016-10-01 MED ORDER — TETANUS-DIPHTH-ACELL PERTUSSIS 5-2.5-18.5 LF-MCG/0.5 IM SUSP: 0.5000 mL | Freq: Once | INTRAMUSCULAR | Status: DC

## 2016-10-01 MED ORDER — OXYTOCIN BOLUS FROM INFUSION
500.0000 mL | Freq: Once | INTRAVENOUS | Status: AC
  Administered 2016-10-01: 500 mL via INTRAVENOUS

## 2016-10-01 MED ORDER — WITCH HAZEL-GLYCERIN EX PADS: 1.0000 "application " | MEDICATED_PAD | CUTANEOUS | Status: DC | PRN

## 2016-10-01 MED ORDER — ACETAMINOPHEN 325 MG PO TABS: 650.0000 mg | ORAL_TABLET | ORAL | Status: DC | PRN

## 2016-10-01 MED ORDER — COCONUT OIL OIL: 1.0000 "application " | TOPICAL_OIL | Status: DC | PRN

## 2016-10-01 MED ORDER — IBUPROFEN 600 MG PO TABS
600.0000 mg | ORAL_TABLET | Freq: Four times a day (QID) | ORAL | Status: DC
  Filled 2016-10-01: qty 1

## 2016-10-01 MED ORDER — SENNOSIDES-DOCUSATE SODIUM 8.6-50 MG PO TABS
2.0000 | ORAL_TABLET | ORAL | Status: DC
  Administered 2016-10-02: 2 via ORAL
  Filled 2016-10-01: qty 2

## 2016-10-01 MED ORDER — LACTATED RINGERS IV SOLN
INTRAVENOUS | Status: DC
  Administered 2016-10-01: 01:00:00 via INTRAVENOUS

## 2016-10-01 MED ORDER — OXYCODONE-ACETAMINOPHEN 5-325 MG PO TABS: 2.0000 | ORAL_TABLET | ORAL | Status: DC | PRN

## 2016-10-01 MED ORDER — BENZOCAINE-MENTHOL 20-0.5 % EX AERO: 1.0000 "application " | INHALATION_SPRAY | CUTANEOUS | Status: DC | PRN

## 2016-10-01 NOTE — H&P (Signed)
Alyssa Massey is a 31 y.o. female presenting for ROM and contractions. OB History    Gravida Para Term Preterm AB Living   3 2 2  0 0 2   SAB TAB Ectopic Multiple Live Births   0 0 0 0 2     Past Medical History:  Diagnosis Date  . Medical history non-contributory    Past Surgical History:  Procedure Laterality Date  . NO PAST SURGERIES     Family History: family history includes Alcohol abuse in her father; Alzheimer's disease in her maternal grandfather; Cancer in her paternal grandfather; Diabetes in her paternal grandmother; Hypertension in her paternal grandmother; Other in her maternal grandmother and mother. Social History:  reports that she has been smoking Cigarettes.  She has a 7.00 pack-year smoking history. She has never used smokeless tobacco. She reports that she has current or past drug history, including Marijuana. She reports that she does not drink alcohol.     Maternal Diabetes: No Genetic Screening: Normal Maternal Ultrasounds/Referrals: Normal Fetal Ultrasounds or other Referrals:  None Maternal Substance Abuse:  Yes:  Type: Smoker, Marijuana Significant Maternal Medications:  None Significant Maternal Lab Results:  None Other Comments:  None  Review of Systems  Constitutional: Negative for fever.  Gastrointestinal: Positive for abdominal pain. Negative for vomiting.  Genitourinary: Negative for dysuria.   Maternal Medical History:  Reason for admission: Rupture of membranes.   Contractions: Onset was 3-5 hours ago.   Frequency: regular.   Perceived severity is strong.    Fetal activity: Perceived fetal activity is normal.   Last perceived fetal movement was within the past hour.    Prenatal Complications - Diabetes: none.    Dilation: 6 Effacement (%): 80 Station: -2 Exam by:: Alexx Gagliardo Blood pressure 126/83, pulse (!) 110, temperature 98 F (36.7 C), temperature source Oral, resp. rate 19, last menstrual period 12/30/2015, SpO2 100  %. Maternal Exam:  Uterine Assessment: Contraction strength is firm.  Contraction frequency is regular.   Abdomen: Patient reports no abdominal tenderness. Fetal presentation: vertex  Introitus: Normal vulva. Normal vagina.  Ferning test: positive.   Pelvis: adequate for delivery.   Cervix: Cervix evaluated by digital exam.     Fetal Exam Fetal Monitor Review: Mode: ultrasound.   Baseline rate: 145.  Variability: moderate (6-25 bpm).   Pattern: accelerations present and no decelerations.    Fetal State Assessment: Category I - tracings are normal.     Physical Exam  Nursing note and vitals reviewed. Constitutional: She is oriented to person, place, and time. She appears well-developed and well-nourished. No distress.  HENT:  Head: Normocephalic.  Cardiovascular: Normal rate.   Respiratory: Effort normal.  GI: Soft. There is no tenderness. There is no rebound.  Neurological: She is alert and oriented to person, place, and time.  Skin: Skin is warm and dry.  Psychiatric: She has a normal mood and affect.    Prenatal labs: ABO, Rh: --/--/A NEG (06/22 0032) Antibody: POS (06/22 0032) Rubella: 1.80 (01/09 1248) RPR: Non Reactive (05/07 0910)  HBsAg: Negative (01/09 1248)  HIV:   NEGATIVE  GBS:   POSITIVE   Assessment/Plan: Active labor at 819w3d  Desires water birth GBS+ Admit to labor and delivery  GBS prophylaxis Anticipate NSVD.    Thressa ShellerHeather Hogan 10/01/2016, 12:56 AM

## 2016-10-01 NOTE — MAU Note (Signed)
Pt states water broke at 10:45pm-clear fluid. Contractions every 2-3 mins. States she was 2cm last week. Pt denies vaginal bleeding. Reports fetal movement, although decreased today. Planning water birth.

## 2016-10-01 NOTE — Progress Notes (Signed)
As this RN was recovering patient, family noted to be watching a video of the birth. CNM Luna KitchensKathryn Kooistra notified and family and visitors were educated that this was not allowed and the video needed to be deleted. Family and visitors apologized, reported that they understood, and reported that they would delete the video.

## 2016-10-02 LAB — RH IG WORKUP (INCLUDES ABO/RH)
ABO/RH(D): A NEG
Fetal Screen: NEGATIVE
Gestational Age(Wks): 39.3
UNIT DIVISION: 0

## 2016-10-02 MED ORDER — IBUPROFEN 600 MG PO TABS: 600.0000 mg | ORAL_TABLET | Freq: Four times a day (QID) | ORAL | 0 refills | Status: DC

## 2016-10-02 MED ORDER — ETONOGESTREL-ETHINYL ESTRADIOL 0.12-0.015 MG/24HR VA RING: VAGINAL_RING | VAGINAL | 12 refills | Status: DC

## 2016-10-02 NOTE — Clinical Social Work Maternal (Signed)
  CLINICAL SOCIAL WORK MATERNAL/CHILD NOTE  Patient Details  Name: Alyssa Massey MRN: [id number] Date of Birth: June 27, 1985  Date:  10/02/2016  Clinical Social Worker Initiating Note:  Ferdinand Lango Veleria Barnhardt, MSW, LCSW-A  Date/ Time Initiated:  10/02/16/0857     Child's Name:  Ilda Foil    Legal Guardian:  Other (Comment) (Not estalished by court system; MOB parents alone )   Need for Interpreter:  None   Date of Referral:  10/01/16     Reason for Referral:  Current Substance Use/Substance Use During Pregnancy    Referral Source:  RN   Address:  [address] [place], [place] [address]  Phone number:  [phone]   Household Members:  Self, Minor Children Costella Hatcher DOB 12/03/07, Payten Fincher DOB 09/10/10)   Natural Supports (not living in the home):  Friends Bing [phone])   Professional Supports: None   Employment: Part-time   Type of Work: Oceanographer    Education:  Database administrator Resources:  Medicaid   Other Resources:  [employer], Food Stamps    Cultural/Religious Considerations Which May Impact Care:  None reported.   Strengths:  Pediatrician chosen , Ability to meet basic needs , Compliance with medical plan , Home prepared for child  ([facility] )   Risk Factors/Current Problems:  Substance Use  (MOB's UDS (+) for [facility] 03/18/2016 & 08/18/2016)   Cognitive State:  Able to Concentrate , Alert , Goal Oriented , Insightful    Mood/Affect:  Calm , Comfortable , Interested , Happy    CSW Assessment: CSW met with MOB at bedside to complete assessment for consult regarding MOB's hx of THC use during pregnancy. Upon this writers arrival, MOB was in bed resting while baby was asleep in basinet. This Probation officer explained role and reasoning for visit. MOB was warm and welcoming. CSW inquired about substance use during pregnancy. MOB was fourth coming noting she does use THC recreationally. This Probation officer informed MOB of the  hospitals policy and procedure regarding substance use during pregnancy and mandated reporting. This Probation officer also informed MOB of the two screens taken of baby to include UDS and CDS. MOB verbalized understanding and noted no further questions or concerns. This Probation officer informed MOB that baby's UDS is negative; however, the CDS is still pending. This Probation officer explained to MOB that UDS results go back three months from the date they are taken. MOB verbalized understanding. At this time, no other needs were addressed or requested. CSW will continue to follow pending CDS results.   CSW Plan/Description:  Information/Referral to [employer] , No Further Intervention Required/No Barriers to Discharge, Patient/Family Education , Other (Comment) (CSW will continue to follow pending CDS results and make report to DSS for positive screen)   Oda Cogan, MSW, [facility]  Office: [phone]

## 2016-10-02 NOTE — Discharge Summary (Signed)
OB Discharge Summary  Patient Name: Alyssa Massey DOB: 07/06/1985 MRN: [id number]  Date of admission: 10/01/2016 Delivering MD: Marylene LandKOOISTRA, KATHRYN LORRAINE   Date of discharge: 10/02/2016  Admitting diagnosis: 39 WEEKS ROM CTX Intrauterine pregnancy: 6261w3d     Secondary diagnosis:Active Problems:   Normal labor  Additional problems:smoker, +GBS     Discharge diagnosis: Term Pregnancy Delivered                                                                      Complications: None  Hospital course:  Onset of Labor With Vaginal Delivery as a waterbirth.   31 y.o. yo [id number] at 361w3d was admitted in Active Labor on 10/01/2016. Patient had an uncomplicated labor course as follows:  Membrane Rupture Time/Date: 10:45 PM ,09/30/2016   Intrapartum Procedures: Episiotomy: None [1]                                         Lacerations:  None [1]  Patient had a delivery of a Viable infant. 10/01/2016  Information for the patient's newborn:  Havery MorosGriffin, Girl Janvi [[id number]][[id number]]  Delivery Method: Vaginal, Spontaneous Delivery (Filed from Delivery Summary)    Pateint had an uncomplicated postpartum course.  She is ambulating, tolerating a regular diet, passing flatus, and urinating well. Patient is discharged home in stable condition on 10/02/16.   Physical exam  Vitals:   10/01/16 0440 10/01/16 0853 10/01/16 1847 10/02/16 0512  BP: 120/78 124/79 121/78 113/71  Pulse: 81 75 77 66  Resp: 18 18 17 16   Temp: 98.6 F (37 C) 98.4 F (36.9 C) 98.6 F (37 C) 97.8 F (36.6 C)  TempSrc: Oral Oral Oral Oral  SpO2:   100% 100%  Weight:      Height:       General: alert Lochia: appropriate Uterine Fundus: firm and NT at U Incision: N/A DVT Evaluation: No evidence of DVT seen on physical exam. Labs: Lab Results  Component Value Date   WBC 20.2 (H) 10/01/2016   HGB 11.0 (L) 10/01/2016   HCT 31.3 (L) 10/01/2016   MCV 89.7 10/01/2016   PLT 140 (L) 10/01/2016   CMP Latest Ref Rng &  Units 09/12/2016  Glucose 65 - 99 mg/dL 130(Q107(H)  BUN 6 - 20 mg/dL 7  Creatinine 6.570.44 - 8.461.00 mg/dL 9.620.53  Sodium 952135 - 841145 mmol/L 137  Potassium 3.5 - 5.1 mmol/L 3.0(L)  Chloride 101 - 111 mmol/L 106  CO2 22 - 32 mmol/L 19(L)  Calcium 8.9 - 10.3 mg/dL 8.1(L)  Total Protein 6.0 - 8.5 g/dL -  Total Bilirubin 0.0 - 1.2 mg/dL -  Alkaline Phos 39 - 324117 IU/L -  AST 0 - 40 IU/L -  ALT 0 - 32 IU/L -    Discharge instruction: per After Visit Summary and "Baby and Me Booklet".  After Visit Meds:  Allergies as of 10/02/2016   No Known Allergies     Medication List    STOP taking these medications   ondansetron 8 MG disintegrating tablet Commonly known as:  ZOFRAN ODT   promethazine 25 MG tablet Commonly known as:  PHENERGAN     TAKE these medications   etonogestrel-ethinyl estradiol 0.12-0.015 MG/24HR vaginal ring Commonly known as:  NUVARING Insert vaginally and leave in place for 3 consecutive weeks, then remove for 1 week.   flintstones complete 60 MG chewable tablet Chew 1 tablet by mouth daily.   ibuprofen 600 MG tablet Commonly known as:  ADVIL,MOTRIN Take 1 tablet (600 mg total) by mouth every 6 (six) hours.   potassium chloride 10 MEQ tablet Commonly known as:  K-DUR Take 1 tablet (10 mEq total) by mouth daily.       Diet: routine diet  Activity: Advance as tolerated. Pelvic rest for 6 weeks.   Outpatient follow up:4 weeks Follow up Appt:Future Appointments Date Time Provider Department Center  11/12/2016 10:00 AM Cheral MarkerBooker, Kimberly R, CNM [facility]   Follow up visit: No Follow-up on file.  Postpartum contraception: Nuvaring, start in 2 weeks  Newborn Data: Live born female  Birth Weight: 6 lb 11.9 oz (3059 g) APGAR: 8, 9  Baby Feeding: Bottle Disposition:home with mother   10/02/2016 Allie BossierMyra C Devantae Babe, MD

## 2016-10-02 NOTE — Progress Notes (Signed)
POSTPARTUM PROGRESS NOTE  Post Partum Day 1 Subjective:  Alyssa Massey is a 31 y.o. [phone] 7419w3d s/p NSVD water birth.  No acute events overnight.  Pt denies problems with ambulating, voiding or po intake.  She denies nausea or vomiting.  Pain is well controlled.  She has had flatus. She has had bowel movement.  Lochia Minimal. Pt would like to go home today of possible.   Objective: Blood pressure 113/71, pulse 66, temperature 97.8 F (36.6 C), temperature source Oral, resp. rate 16, height 5\' 6"  (1.676 m), weight 89.8 kg (198 lb), last menstrual period 12/30/2015, SpO2 100 %, unknown if currently breastfeeding.  Physical Exam:  General: alert, cooperative and no distress Chest: CTAB Heart: RRR no m/r/g Abdomen: +BS, soft, nontender,  DVT Evaluation: No calf swelling or tenderness Extremities: no edema   Recent Labs  10/01/16 0040 10/01/16 0553  HGB 12.1 11.0*  HCT 34.6* 31.3*    Assessment/Plan:  ASSESSMENT: Alyssa Massey is a 31 y.o. [id number] 8219w3d s/p NSVD via waterbirth   Discharge home   LOS: 1 day   Sullivan LoneBrannon L Valori Hollenkamp, Medical Student

## 2016-10-02 NOTE — Discharge Instructions (Signed)

## 2016-10-05 LAB — TYPE AND SCREEN
ABO/RH(D): A NEG
Antibody Screen: POSITIVE
DAT, IGG: NEGATIVE
Unit division: 0
Unit division: 0

## 2016-10-05 LAB — BPAM RBC
BLOOD PRODUCT EXPIRATION DATE: 201808242359
BLOOD PRODUCT EXPIRATION DATE: 201808252359
Unit Type and Rh: 600
Unit Type and Rh: 600

## 2016-11-12 ENCOUNTER — Encounter: Payer: Self-pay | Admitting: Women's Health

## 2016-11-12 ENCOUNTER — Ambulatory Visit (INDEPENDENT_AMBULATORY_CARE_PROVIDER_SITE_OTHER): Payer: Medicaid Other | Admitting: Women's Health

## 2016-11-12 DIAGNOSIS — Z3202 Encounter for pregnancy test, result negative: Secondary | ICD-10-CM | POA: Diagnosis not present

## 2016-11-12 LAB — POCT URINE PREGNANCY: PREG TEST UR: NEGATIVE

## 2016-11-12 NOTE — Progress Notes (Signed)
Subjective:    Alyssa Massey is a 31 y.o. 743P3003 Caucasian female who presents for a postpartum visit. She is 5 weeks postpartum following a spontaneous vaginal delivery/waterbirth at 39.3 gestational weeks. Anesthesia: none. I have fully reviewed the prenatal and intrapartum course. Postpartum course has been uncomplicated. Baby's course has been uncomplicated. Baby is feeding by bottle. Bleeding no bleeding. Bowel function is normal. Bladder function is normal. Patient is sexually active. Contraception method is partner has had a vasectomy. Pt has rx for nuvaring- hasn't started. This is a different partner than the fob. Postpartum depression screening: negative. Score 3.  Last pap 07/31/15 and was neg.  The following portions of the patient's history were reviewed and updated as appropriate: allergies, current medications, past medical history, past surgical history and problem list.  Review of Systems Pertinent items are noted in HPI.   Vitals:   11/12/16 1030  BP: 108/62  Pulse: 78  Weight: 180 lb (81.6 kg)  Height: 5\' 6"  (1.676 m)   Patient's last menstrual period was 12/30/2015 (approximate).  Objective:   General:  alert, cooperative and no distress   Breasts:  deferred, no complaints  Lungs: clear to auscultation bilaterally  Heart:  regular rate and rhythm  Abdomen: soft, nontender   Vulva: normal  Vagina: normal vagina  Cervix:  closed  Corpus: Well-involuted  Adnexa:  Non-palpable  Rectal Exam: No hemorrhoids        Assessment:   Postpartum exam 5 wks s/p SVB/waterbirth Bottlefeeding Depression screening Contraception counseling   Plan:  Contraception: current partner has had vasectomy, start nuvaring w/ 2wks condoms if changes partner Follow up in: May for physical, or earlier if needed  Marge DuncansBooker, Girard Koontz Randall CNM, [facility]NP-[facility] 11/12/2016 10:40 AM

## 2017-04-17 ENCOUNTER — Telehealth: Payer: Self-pay | Admitting: Women's Health

## 2017-04-17 MED ORDER — NORETHINDRONE ACET-ETHINYL EST 1-20 MG-MCG PO TABS: 1.0000 | ORAL_TABLET | Freq: Every day | ORAL | 11 refills | Status: DC

## 2017-04-17 NOTE — Telephone Encounter (Signed)
Left message that pills sent, use condoms for 1 pack

## 2017-04-17 NOTE — Telephone Encounter (Signed)
Patient called stating that she tried to call her pharmacy to get  refill of her nuva ring but her pharmacy said they don't have any. Pt states if she could get instead of the Nuva ring the pill. Please contact pt

## 2019-11-10 ENCOUNTER — Encounter: Payer: Self-pay | Admitting: Advanced Practice Midwife

## 2019-11-10 ENCOUNTER — Other Ambulatory Visit (HOSPITAL_COMMUNITY)
Admission: RE | Admit: 2019-11-10 | Discharge: 2019-11-10 | Disposition: A | Discharge status: Home / Self Care | Payer: Medicaid Other | Source: Ambulatory Visit | Attending: Advanced Practice Midwife | Admitting: Advanced Practice Midwife

## 2019-11-10 ENCOUNTER — Ambulatory Visit (INDEPENDENT_AMBULATORY_CARE_PROVIDER_SITE_OTHER): Payer: Medicaid Other | Admitting: Advanced Practice Midwife

## 2019-11-10 VITALS — BP 123/79 | HR 78 | Ht 65.0 in | Wt 162.0 lb

## 2019-11-10 DIAGNOSIS — Z124 Encounter for screening for malignant neoplasm of cervix: Secondary | ICD-10-CM | POA: Diagnosis not present

## 2019-11-10 DIAGNOSIS — Z01419 Encounter for gynecological examination (general) (routine) without abnormal findings: Secondary | ICD-10-CM

## 2019-11-10 NOTE — Progress Notes (Signed)
Alyssa Massey 34 y.o.  Vitals:   11/10/19 1011  BP: 123/79  Pulse: 78     Filed Weights   11/10/19 1011  Weight: 162 lb (73.5 kg)    Past Medical History: Past Medical History:  Diagnosis Date  . Medical history non-contributory     Past Surgical History: Past Surgical History:  Procedure Laterality Date  . NO PAST SURGERIES      Family History: Family History  Problem Relation Age of Onset  . Other Mother        3 blockages in heart  . Alcohol abuse Father   . Cancer Paternal Grandfather   . Diabetes Paternal Grandmother   . Hypertension Paternal Grandmother   . Other Maternal Grandmother        brain tumor  . Alzheimer's disease Maternal Grandfather     Social History: Social History   Tobacco Use  . Smoking status: Current Every Day Smoker    Packs/day: 0.50    Years: 14.00    Pack years: 7.00    Types: Cigarettes  . Smokeless tobacco: Never Used  Vaping Use  . Vaping Use: Never used  Substance Use Topics  . Alcohol use: Yes    Comment: rarely  . Drug use: Yes    Frequency: 7.0 times per week    Types: Marijuana    Allergies: No Known Allergies   No current outpatient medications on file.  History of Present Illness: Here for pap and physical.  Last pap 07/31/15, normal.  Partner had vasectomy.  Periods about every 30+ days or so.    Review of Systems   Patient denies any headaches, blurred vision, shortness of breath, chest pain, abdominal pain, problems with bowel movements, urination, or intercourse.   Physical Exam: General:  Well developed, well nourished, no acute distress Skin:  Warm and dry Neck:  Midline trachea, normal thyroid Lungs; Clear to auscultation bilaterally Breast:  No dominant palpable mass, retraction, or nipple discharge Cardiovascular: Regular rate and rhythm Abdomen:  Soft, non tender, no hepatosplenomegaly Pelvic:  External genitalia is normal in appearance.  The vagina is normal in appearance.  The cervix  is bulbous.  Uterus is felt to be normal size, shape, and contour.  No adnexal masses or tenderness noted.  Extremities:  No swelling or varicosities noted Psych:  No mood changes.     Impression: normal GYN exam     Plan: if pap normal, repeat 3-5 years Mammogram age 48

## 2019-11-11 LAB — CYTOLOGY - PAP
Chlamydia: NEGATIVE
Comment: NEGATIVE
Comment: NEGATIVE
Comment: NORMAL
Diagnosis: NEGATIVE
High risk HPV: NEGATIVE
Neisseria Gonorrhea: NEGATIVE
